# Patient Record
Sex: Male | Born: 1955 | Race: Black or African American | Hispanic: No | Marital: Married | Smoking: Never smoker
Health system: Southern US, Community
[De-identification: ages and names within clinical notes are randomized; demographics above are authoritative.]

## PROBLEM LIST (undated history)

## (undated) DIAGNOSIS — E11319 Type 2 diabetes mellitus with unspecified diabetic retinopathy without macular edema: Secondary | ICD-10-CM

## (undated) DIAGNOSIS — I10 Essential (primary) hypertension: Secondary | ICD-10-CM

## (undated) DIAGNOSIS — E119 Type 2 diabetes mellitus without complications: Secondary | ICD-10-CM

## (undated) DIAGNOSIS — G8929 Other chronic pain: Secondary | ICD-10-CM

## (undated) DIAGNOSIS — K219 Gastro-esophageal reflux disease without esophagitis: Secondary | ICD-10-CM

## (undated) HISTORY — PX: FRACTURE SURGERY: SHX138

---

## 2007-08-10 DIAGNOSIS — I639 Cerebral infarction, unspecified: Secondary | ICD-10-CM

## 2007-08-10 HISTORY — DX: Cerebral infarction, unspecified: I63.9

## 2011-02-13 DIAGNOSIS — I10 Essential (primary) hypertension: Secondary | ICD-10-CM | POA: Diagnosis present

## 2017-06-20 ENCOUNTER — Encounter: Payer: Self-pay | Admitting: *Deleted

## 2017-06-20 ENCOUNTER — Emergency Department
Admission: EM | Admit: 2017-06-20 | Discharge: 2017-06-20 | Disposition: A | Discharge status: Home / Self Care | Payer: Medicare Other | Attending: Emergency Medicine | Admitting: Emergency Medicine

## 2017-06-20 ENCOUNTER — Other Ambulatory Visit: Payer: Self-pay

## 2017-06-20 ENCOUNTER — Ambulatory Visit
Admission: EM | Admit: 2017-06-20 | Discharge: 2017-06-20 | Disposition: A | Discharge status: Hospice | Payer: Medicare Other | Source: Home / Self Care | Attending: Family Medicine | Admitting: Family Medicine

## 2017-06-20 ENCOUNTER — Emergency Department: Payer: Medicare Other

## 2017-06-20 ENCOUNTER — Encounter: Payer: Self-pay | Admitting: Emergency Medicine

## 2017-06-20 DIAGNOSIS — L03116 Cellulitis of left lower limb: Secondary | ICD-10-CM | POA: Diagnosis not present

## 2017-06-20 DIAGNOSIS — M25562 Pain in left knee: Secondary | ICD-10-CM | POA: Insufficient documentation

## 2017-06-20 DIAGNOSIS — Z794 Long term (current) use of insulin: Secondary | ICD-10-CM

## 2017-06-20 DIAGNOSIS — W19XXXA Unspecified fall, initial encounter: Secondary | ICD-10-CM | POA: Diagnosis not present

## 2017-06-20 DIAGNOSIS — Z5321 Procedure and treatment not carried out due to patient leaving prior to being seen by health care provider: Secondary | ICD-10-CM | POA: Diagnosis not present

## 2017-06-20 DIAGNOSIS — R6883 Chills (without fever): Secondary | ICD-10-CM | POA: Diagnosis not present

## 2017-06-20 DIAGNOSIS — E118 Type 2 diabetes mellitus with unspecified complications: Secondary | ICD-10-CM | POA: Diagnosis not present

## 2017-06-20 DIAGNOSIS — S8992XA Unspecified injury of left lower leg, initial encounter: Secondary | ICD-10-CM | POA: Diagnosis not present

## 2017-06-20 DIAGNOSIS — R509 Fever, unspecified: Secondary | ICD-10-CM | POA: Diagnosis not present

## 2017-06-20 HISTORY — DX: Essential (primary) hypertension: I10

## 2017-06-20 HISTORY — DX: Type 2 diabetes mellitus without complications: E11.9

## 2017-06-20 LAB — CBC WITH DIFFERENTIAL/PLATELET
BASOS ABS: 0 10*3/uL (ref 0–0.1)
BASOS PCT: 0 %
Eosinophils Absolute: 0.1 10*3/uL (ref 0–0.7)
Eosinophils Relative: 1 %
HEMATOCRIT: 45.8 % (ref 40.0–52.0)
HEMOGLOBIN: 14.7 g/dL (ref 13.0–18.0)
Lymphocytes Relative: 37 %
Lymphs Abs: 2.2 10*3/uL (ref 1.0–3.6)
MCH: 27.4 pg (ref 26.0–34.0)
MCHC: 32.1 g/dL (ref 32.0–36.0)
MCV: 85.3 fL (ref 80.0–100.0)
MONOS PCT: 9 %
Monocytes Absolute: 0.5 10*3/uL (ref 0.2–1.0)
NEUTROS ABS: 3.1 10*3/uL (ref 1.4–6.5)
NEUTROS PCT: 53 %
Platelets: 149 10*3/uL — ABNORMAL LOW (ref 150–440)
RBC: 5.37 MIL/uL (ref 4.40–5.90)
RDW: 14.5 % (ref 11.5–14.5)
WBC: 5.8 10*3/uL (ref 3.8–10.6)

## 2017-06-20 LAB — COMPREHENSIVE METABOLIC PANEL
ALK PHOS: 78 U/L (ref 38–126)
ALT: 17 U/L (ref 17–63)
ANION GAP: 6 (ref 5–15)
AST: 21 U/L (ref 15–41)
Albumin: 3.9 g/dL (ref 3.5–5.0)
BUN: 13 mg/dL (ref 6–20)
CALCIUM: 8.6 mg/dL — AB (ref 8.9–10.3)
CO2: 23 mmol/L (ref 22–32)
Chloride: 108 mmol/L (ref 101–111)
Creatinine, Ser: 1.06 mg/dL (ref 0.61–1.24)
GFR calc non Af Amer: >60 mL/min (ref >60)
Glucose, Bld: 172 mg/dL — ABNORMAL HIGH (ref 65–99)
Potassium: 4.2 mmol/L (ref 3.5–5.1)
SODIUM: 137 mmol/L (ref 135–145)
Total Bilirubin: 0.4 mg/dL (ref 0.3–1.2)
Total Protein: 7.5 g/dL (ref 6.5–8.1)

## 2017-06-20 LAB — SEDIMENTATION RATE: Sed Rate: 9 mm/hr (ref 0–20)

## 2017-06-20 LAB — LACTIC ACID, PLASMA: LACTIC ACID, VENOUS: 1.1 mmol/L (ref 0.5–1.9)

## 2017-06-20 LAB — C-REACTIVE PROTEIN: CRP: 1.2 mg/dL — AB (ref <1.0)

## 2017-06-20 MED ORDER — ACETAMINOPHEN 500 MG PO TABS
1000.0000 mg | ORAL_TABLET | Freq: Once | ORAL | Status: AC
  Administered 2017-06-20: 1000 mg via ORAL

## 2017-06-20 NOTE — ED Triage Notes (Signed)
Pt tripped on a rug at home and fell last night. Now c/o left knee pain and edema. Has hx of previous right hip injury that causes him to limp and has unsteady gait.

## 2017-06-20 NOTE — ED Notes (Signed)
Pt in car, eating, states that his is coming back inside quickly.

## 2017-06-20 NOTE — ED Notes (Signed)
No answer when called several times from lobby 

## 2017-06-20 NOTE — ED Notes (Signed)
Pt back in lobby after eating.

## 2017-06-20 NOTE — Discharge Instructions (Signed)
Recommend patient go to Emergency Department for further evaluation/management 

## 2017-06-20 NOTE — ED Triage Notes (Signed)
Pt fell Saturday, pain to left knee since. Swelling and tenderness to touch. No LOC in skin integrity. [facility] and sent here. Fever of 103F prior to tylenol adminstration.

## 2017-06-20 NOTE — ED Notes (Addendum)
Pt ambulates out of lobby door to parked car. Pt relations, Alfredia ClientMary Jo out to car to inquire what patient is doing. Pt ambulates at his baseline with a cane.

## 2017-06-20 NOTE — ED Provider Notes (Signed)
[facility]    CSN: [id number] Arrival date & time: 06/20/17  1127     History   Chief Complaint Chief Complaint  Patient presents with  . Fall  . Leg Pain    HPI Jose Richardson is a 61 y.o. male.   61 yo diabetic male with a c/o left knee pain and swelling since yesterday after falling 2 days ago (Saturday night) and landing on his knee and shin. Patient did scrape the skin on his shin. States last night he also started having chills.    The history is provided by the patient.  Fall   Leg Pain    Past Medical History:  Diagnosis Date  . Diabetes mellitus without complication (HCC)   . Hypertension     There are no active problems to display for this patient.   Past Surgical History:  Procedure Laterality Date  . FRACTURE SURGERY         Home Medications    Prior to Admission medications   Medication Sig Start Date End Date Taking? Authorizing Provider  insulin aspart (NOVOLOG) 100 UNIT/ML injection Inject 3 (three) times daily before meals into the skin.   Yes [provider]  insulin glargine (LANTUS) 100 UNIT/ML injection Inject at bedtime into the skin.   Yes [provider]  losartan (COZAAR) 25 MG tablet Take 25 mg daily by mouth.   Yes [provider]  lovastatin (MEVACOR) 20 MG tablet Take 20 mg at bedtime by mouth.   Yes [provider]  metFORMIN (GLUCOPHAGE) 500 MG tablet Take 500 mg 2 (two) times daily with a meal by mouth.   Yes [provider]  methadone (DOLOPHINE) 10 MG tablet Take 10 mg every 8 (eight) hours by mouth.   Yes [provider]  Pregabalin (LYRICA PO) Take 500 mg by mouth.   Yes [provider]    Family History Family History  Problem Relation Age of Onset  . Cancer Mother   . Stroke Father     Social History Social History   Tobacco Use  . Smoking status: Never Smoker  . Smokeless tobacco: Never Used  Substance Use Topics  . Alcohol  use: No    Frequency: Never  . Drug use: No     Allergies   Patient has no known allergies.   Review of Systems Review of Systems   Physical Exam Triage Vital Signs ED Triage Vitals  Enc Vitals Group     BP 06/20/17 1143 (!) 166/79     Pulse Rate 06/20/17 1143 77     Resp 06/20/17 1143 16     Temp 06/20/17 1143 (!) 103 F (39.4 C)     Temp Source 06/20/17 1143 Oral     SpO2 06/20/17 1143 98 %     Weight 06/20/17 1147 219 lb (99.3 kg)     Height 06/20/17 1147 5\' 6"  (1.676 m)     Head Circumference --      Peak Flow --      Pain Score 06/20/17 1151 8     Pain Loc --      Pain Edu? --      Excl. in GC? --    No data found.  Updated Vital Signs BP (!) 166/79 (BP Location: Left Arm)   Pulse 77   Temp (!) 103 F (39.4 C) (Oral)   Resp 16   Ht 5\' 6"  (1.676 m)   Wt 219 lb (99.3 kg)  SpO2 98%   BMI 35.35 kg/m   Visual Acuity Right Eye Distance:   Left Eye Distance:   Bilateral Distance:    Right Eye Near:   Left Eye Near:    Bilateral Near:     Physical Exam  Constitutional: He appears well-developed and well-nourished. No distress.  Musculoskeletal: He exhibits edema (left lower extremity).  Skin: He is not diaphoretic.  Left knee edema, blanchable erythema, warmth and tenderness to palpation;   Nursing note and vitals reviewed.    UC Treatments / Results  Labs (all labs ordered are listed, but only abnormal results are displayed) Labs Reviewed - No data to display  EKG  EKG Interpretation None       Radiology No results found.  Procedures Procedures (including critical care time)  Medications Ordered in UC Medications  acetaminophen (TYLENOL) tablet 1,000 mg (1,000 mg Oral Given 06/20/17 1149)     Initial Impression / Assessment and Plan / UC Course  I have reviewed the triage vital signs and the nursing notes.  Pertinent labs & imaging results that were available during my care of the patient were reviewed by me and considered in  my medical decision making (see chart for details).       Final Clinical Impressions(s) / UC Diagnoses   Final diagnoses:  Injury of left knee, initial encounter  Cellulitis of leg, left  Fever, unspecified fever cause  Chills  Controlled type 2 diabetes mellitus with complication, with long-term current use of insulin [facility](HCC)    ED Discharge Orders    None     1. diagnosis reviewed with patient; recommend patient go to ED for further evaluation and management. Patient verbalizes understanding and will proceed by private vehicle. Report called to [facility] triage RN.   Controlled Substance Prescriptions [place] Controlled Substance Registry consulted? Not Applicable   Payton Mccallumonty, Judy Pollman, MD 06/20/17 1227

## 2017-06-21 ENCOUNTER — Telehealth: Payer: Self-pay | Admitting: Emergency Medicine

## 2017-06-21 NOTE — Telephone Encounter (Signed)
Called patient due to lwot to inquire about condition and follow up plans. Left message.   

## 2018-02-02 ENCOUNTER — Other Ambulatory Visit: Payer: Self-pay

## 2018-02-02 ENCOUNTER — Emergency Department
Admission: EM | Admit: 2018-02-02 | Discharge: 2018-02-02 | Disposition: A | Discharge status: Home / Self Care | Payer: Medicare Other | Attending: Emergency Medicine | Admitting: Emergency Medicine

## 2018-02-02 ENCOUNTER — Emergency Department: Payer: Medicare Other

## 2018-02-02 DIAGNOSIS — R0789 Other chest pain: Secondary | ICD-10-CM | POA: Insufficient documentation

## 2018-02-02 DIAGNOSIS — Z79899 Other long term (current) drug therapy: Secondary | ICD-10-CM | POA: Insufficient documentation

## 2018-02-02 DIAGNOSIS — E119 Type 2 diabetes mellitus without complications: Secondary | ICD-10-CM | POA: Diagnosis not present

## 2018-02-02 DIAGNOSIS — R079 Chest pain, unspecified: Secondary | ICD-10-CM | POA: Diagnosis present

## 2018-02-02 DIAGNOSIS — I10 Essential (primary) hypertension: Secondary | ICD-10-CM | POA: Insufficient documentation

## 2018-02-02 DIAGNOSIS — Z794 Long term (current) use of insulin: Secondary | ICD-10-CM | POA: Insufficient documentation

## 2018-02-02 LAB — CBC
HCT: 38 % — ABNORMAL LOW (ref 40.0–52.0)
Hemoglobin: 12.7 g/dL — ABNORMAL LOW (ref 13.0–18.0)
MCH: 28.8 pg (ref 26.0–34.0)
MCHC: 33.4 g/dL (ref 32.0–36.0)
MCV: 86.2 fL (ref 80.0–100.0)
PLATELETS: 176 10*3/uL (ref 150–440)
RBC: 4.41 MIL/uL (ref 4.40–5.90)
RDW: 14.3 % (ref 11.5–14.5)
WBC: 7.6 10*3/uL (ref 3.8–10.6)

## 2018-02-02 LAB — COMPREHENSIVE METABOLIC PANEL
ALT: 20 U/L (ref 0–44)
ANION GAP: 7 (ref 5–15)
AST: 25 U/L (ref 15–41)
Albumin: 3.9 g/dL (ref 3.5–5.0)
Alkaline Phosphatase: 89 U/L (ref 38–126)
BUN: 16 mg/dL (ref 8–23)
CHLORIDE: 106 mmol/L (ref 98–111)
CO2: 25 mmol/L (ref 22–32)
CREATININE: 1.29 mg/dL — AB (ref 0.61–1.24)
Calcium: 9.1 mg/dL (ref 8.9–10.3)
GFR, EST NON AFRICAN AMERICAN: 58 mL/min — AB (ref >60)
Glucose, Bld: 120 mg/dL — ABNORMAL HIGH (ref 70–99)
POTASSIUM: 3.9 mmol/L (ref 3.5–5.1)
SODIUM: 138 mmol/L (ref 135–145)
Total Bilirubin: 0.5 mg/dL (ref 0.3–1.2)
Total Protein: 7.7 g/dL (ref 6.5–8.1)

## 2018-02-02 LAB — TROPONIN I: Troponin I: <0.03 ng/mL (ref <0.03)

## 2018-02-02 NOTE — ED Triage Notes (Signed)
Arrived via [facility] for CP that has been intermittent over last couple weeks. Pt report he was outside working in the yard today when the pain started. He has been getting this pain over last couple weeks and takes tums at home to relieve the pain. Denies SOB or N/V/D. 4/10 pain, substernal.

## 2018-02-02 NOTE — ED Provider Notes (Signed)
[facility] Emergency Department Provider Note   ____________________________________________    I have reviewed the triage vital signs and the nursing notes.   HISTORY  Chief Complaint Chest Pain     HPI Jose Richardson is a 62 y.o. male who presents with complaints of chest pain.  Patient reports he was working in the yard mowing his lawn when he developed discomfort substernally.  He describes it as a pressure-like sensation.  Now resolved.  He reports this is been occurring over the last 2 months without pattern.  Typically takes a Tums and that helps his discomfort.  However he has run out of Tums.  Denies diaphoresis.  No shortness of breath.  No pleurisy.  No leg swelling.  No history of heart disease.   Past Medical History:  Diagnosis Date  . Diabetes mellitus without complication (HCC)   . Hypertension     There are no active problems to display for this patient.   Past Surgical History:  Procedure Laterality Date  . FRACTURE SURGERY      Prior to Admission medications   Medication Sig Start Date End Date Taking? Authorizing Provider  insulin aspart (NOVOLOG) 100 UNIT/ML injection Inject 3 (three) times daily before meals into the skin.    [provider]  insulin glargine (LANTUS) 100 UNIT/ML injection Inject at bedtime into the skin.    [provider]  losartan (COZAAR) 25 MG tablet Take 25 mg daily by mouth.    [provider]  lovastatin (MEVACOR) 20 MG tablet Take 20 mg at bedtime by mouth.    [provider]  metFORMIN (GLUCOPHAGE) 500 MG tablet Take 500 mg 2 (two) times daily with a meal by mouth.    [provider]  methadone (DOLOPHINE) 10 MG tablet Take 10 mg every 8 (eight) hours by mouth.    [provider]  Pregabalin (LYRICA PO) Take 500 mg by mouth.    [provider]     Allergies Patient has no known allergies.  Family History  Problem Relation  Age of Onset  . Cancer Mother   . Stroke Father     Social History Social History   Tobacco Use  . Smoking status: Never Smoker  . Smokeless tobacco: Never Used  Substance Use Topics  . Alcohol use: No    Frequency: Never  . Drug use: No    Review of Systems  Constitutional: No fever/chills Eyes: No visual changes.  ENT: No sore throat. Cardiovascular: As above Respiratory: Denies shortness of breath. Gastrointestinal: No abdominal pain.  No nausea, no vomiting.   Genitourinary: Negative for dysuria. Musculoskeletal: Negative for back pain. Skin: Negative for rash. Neurological: Negative for headaches   ____________________________________________   PHYSICAL EXAM:  VITAL SIGNS: ED Triage Vitals  Enc Vitals Group     BP 02/02/18 1210 104/79     Pulse Rate 02/02/18 1153 74     Resp 02/02/18 1153 18     Temp 02/02/18 1153 98.1 F (36.7 C)     Temp Source 02/02/18 1153 Oral     SpO2 02/02/18 1153 97 %     Weight 02/02/18 1155 99.8 kg (220 lb)     Height 02/02/18 1155 1.727 m (5\' 8" )     Head Circumference --      Peak Flow --      Pain Score 02/02/18 1217 4     Pain Loc --      Pain Edu? --  Excl. in GC? --     Constitutional: Alert and oriented. . Pleasant and interactive  Nose: No congestion/rhinnorhea. Mouth/Throat: Mucous membranes are moist.   Neck:  Painless ROM Cardiovascular: Normal rate, regular rhythm. Grossly normal heart sounds.  Good peripheral circulation. Respiratory: Normal respiratory effort.  No retractions. Lungs CTAB. Gastrointestinal: Soft and nontender. No distention.  No CVA tenderness. Genitourinary: deferred Musculoskeletal: No lower extremity tenderness nor edema.  Warm and well perfused Neurologic:  Normal speech and language. No gross focal neurologic deficits are appreciated.  Skin:  Skin is warm, dry and intact. No rash noted. Psychiatric: Mood and affect are normal. Speech and behavior are  normal.  ____________________________________________   LABS (all labs ordered are listed, but only abnormal results are displayed)  Labs Reviewed  CBC - Abnormal; Notable for the following components:      Result Value   Hemoglobin 12.7 (*)    HCT 38.0 (*)    All other components within normal limits  COMPREHENSIVE METABOLIC PANEL - Abnormal; Notable for the following components:   Glucose, Bld 120 (*)    Creatinine, Ser 1.29 (*)    GFR calc non Af Amer 58 (*)    All other components within normal limits  TROPONIN I  TROPONIN I   ____________________________________________  EKG  ED ECG REPORT I, Jene Every, the attending physician, personally viewed and interpreted this ECG.  Date: 02/02/2018  Rhythm: normal sinus rhythm QRS Axis: normal Intervals: normal ST/T Wave abnormalities: normal Narrative Interpretation: no evidence of acute ischemia  ____________________________________________  RADIOLOGY  Chest x-ray unremarkable ____________________________________________   PROCEDURES  Procedure(s) performed: No  Procedures   Critical Care performed:No ____________________________________________   INITIAL IMPRESSION / ASSESSMENT AND PLAN / ED COURSE  Pertinent labs & imaging results that were available during my care of the patient were reviewed by me and considered in my medical decision making (see chart for details).  Patient overall well-appearing in no acute distress.  Symptoms have resolved in the emergency department.  EKG is reassuring.  Will check labs and place patient on cardiac monitor   Initial troponin is normal, lab work overall unremarkable.  Patient continues to feel well.  Informed him we will check a second troponin.   ----------------------------------------- 2:54 PM on 02/02/2018 -----------------------------------------  Second troponin is normal.  Patient continues to feel well this chest pain-free.  He is anxious to leave.   I feel this is reasonable he agrees to close outpatient follow-up with cardiology.  He will return if any chest pain    ____________________________________________   FINAL CLINICAL IMPRESSION(S) / ED DIAGNOSES  Final diagnoses:  Atypical chest pain        Note:  This document was prepared using Dragon voice recognition software and may include unintentional dictation errors.    Jene Every, MD 02/02/18 [phone]

## 2018-12-08 ENCOUNTER — Ambulatory Visit: Payer: Medicare Other

## 2018-12-08 ENCOUNTER — Ambulatory Visit
Admission: EM | Admit: 2018-12-08 | Discharge: 2018-12-08 | Disposition: A | Discharge status: Home / Self Care | Payer: Medicare Other | Attending: Family Medicine | Admitting: Family Medicine

## 2018-12-08 ENCOUNTER — Other Ambulatory Visit: Payer: Self-pay

## 2018-12-08 ENCOUNTER — Encounter: Payer: Self-pay | Admitting: Emergency Medicine

## 2018-12-08 DIAGNOSIS — Z794 Long term (current) use of insulin: Secondary | ICD-10-CM | POA: Diagnosis not present

## 2018-12-08 DIAGNOSIS — Z79899 Other long term (current) drug therapy: Secondary | ICD-10-CM | POA: Insufficient documentation

## 2018-12-08 DIAGNOSIS — E119 Type 2 diabetes mellitus without complications: Secondary | ICD-10-CM | POA: Diagnosis not present

## 2018-12-08 DIAGNOSIS — I10 Essential (primary) hypertension: Secondary | ICD-10-CM | POA: Insufficient documentation

## 2018-12-08 DIAGNOSIS — M79604 Pain in right leg: Secondary | ICD-10-CM | POA: Insufficient documentation

## 2018-12-08 DIAGNOSIS — M25551 Pain in right hip: Secondary | ICD-10-CM

## 2018-12-08 DIAGNOSIS — Z96641 Presence of right artificial hip joint: Secondary | ICD-10-CM | POA: Insufficient documentation

## 2018-12-08 NOTE — Discharge Instructions (Addendum)
Continue home medication as prescribed. Tylenol over the counter as needed. Ice/Heat. Rest. Drink plenty of fluids.   Follow up with your primary care physician this week as needed. Return to Urgent care for new or worsening concerns.
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
## 2018-12-24 ENCOUNTER — Ambulatory Visit
Admission: EM | Admit: 2018-12-24 | Discharge: 2018-12-24 | Discharge status: Against Medical Advice | Payer: Medicare Other | Attending: Family Medicine | Admitting: Family Medicine

## 2020-03-16 ENCOUNTER — Other Ambulatory Visit: Payer: Self-pay

## 2020-03-16 ENCOUNTER — Observation Stay: Payer: Medicare HMO

## 2020-03-16 ENCOUNTER — Ambulatory Visit
Admission: EM | Admit: 2020-03-16 | Discharge: 2020-03-16 | Disposition: A | Discharge status: Home / Self Care | Payer: Medicare HMO | Source: Home / Self Care | Attending: Family Medicine | Admitting: Family Medicine

## 2020-03-16 ENCOUNTER — Encounter: Payer: Self-pay | Admitting: Emergency Medicine

## 2020-03-16 ENCOUNTER — Observation Stay
Admission: EM | Admit: 2020-03-16 | Discharge: 2020-03-17 | Disposition: A | Discharge status: Home / Self Care | Payer: Medicare HMO | Attending: Internal Medicine | Admitting: Internal Medicine

## 2020-03-16 DIAGNOSIS — R9431 Abnormal electrocardiogram [ECG] [EKG]: Secondary | ICD-10-CM | POA: Diagnosis not present

## 2020-03-16 DIAGNOSIS — R072 Precordial pain: Secondary | ICD-10-CM | POA: Diagnosis not present

## 2020-03-16 DIAGNOSIS — I10 Essential (primary) hypertension: Secondary | ICD-10-CM | POA: Diagnosis not present

## 2020-03-16 DIAGNOSIS — Z79899 Other long term (current) drug therapy: Secondary | ICD-10-CM | POA: Diagnosis not present

## 2020-03-16 DIAGNOSIS — Z794 Long term (current) use of insulin: Secondary | ICD-10-CM | POA: Diagnosis not present

## 2020-03-16 DIAGNOSIS — Z8673 Personal history of transient ischemic attack (TIA), and cerebral infarction without residual deficits: Secondary | ICD-10-CM | POA: Diagnosis not present

## 2020-03-16 DIAGNOSIS — Z20822 Contact with and (suspected) exposure to covid-19: Secondary | ICD-10-CM | POA: Diagnosis not present

## 2020-03-16 DIAGNOSIS — R079 Chest pain, unspecified: Secondary | ICD-10-CM

## 2020-03-16 DIAGNOSIS — I440 Atrioventricular block, first degree: Secondary | ICD-10-CM

## 2020-03-16 DIAGNOSIS — E119 Type 2 diabetes mellitus without complications: Secondary | ICD-10-CM | POA: Insufficient documentation

## 2020-03-16 DIAGNOSIS — IMO0002 Reserved for concepts with insufficient information to code with codable children: Secondary | ICD-10-CM | POA: Diagnosis present

## 2020-03-16 HISTORY — DX: Gastro-esophageal reflux disease without esophagitis: K21.9

## 2020-03-16 HISTORY — DX: Type 2 diabetes mellitus with unspecified diabetic retinopathy without macular edema: E11.319

## 2020-03-16 HISTORY — DX: Other chronic pain: G89.29

## 2020-03-16 HISTORY — DX: Type 2 diabetes mellitus without complications: E11.9

## 2020-03-16 LAB — COMPREHENSIVE METABOLIC PANEL
ALT: 28 U/L (ref 0–44)
AST: 28 U/L (ref 15–41)
Albumin: 3.9 g/dL (ref 3.5–5.0)
Alkaline Phosphatase: 90 U/L (ref 38–126)
Anion gap: 5 (ref 5–15)
BUN: 17 mg/dL (ref 8–23)
CO2: 24 mmol/L (ref 22–32)
Calcium: 8.6 mg/dL — ABNORMAL LOW (ref 8.9–10.3)
Chloride: 107 mmol/L (ref 98–111)
Creatinine, Ser: 1.18 mg/dL (ref 0.61–1.24)
GFR calc Af Amer: >60 mL/min (ref >60)
GFR calc non Af Amer: >60 mL/min (ref >60)
Glucose, Bld: 186 mg/dL — ABNORMAL HIGH (ref 70–99)
Potassium: 4.4 mmol/L (ref 3.5–5.1)
Sodium: 136 mmol/L (ref 135–145)
Total Bilirubin: 0.5 mg/dL (ref 0.3–1.2)
Total Protein: 7.8 g/dL (ref 6.5–8.1)

## 2020-03-16 LAB — CBC WITH DIFFERENTIAL/PLATELET
Abs Immature Granulocytes: 0.03 10*3/uL (ref 0.00–0.07)
Basophils Absolute: 0 10*3/uL (ref 0.0–0.1)
Basophils Relative: 0 %
Eosinophils Absolute: 0.1 10*3/uL (ref 0.0–0.5)
Eosinophils Relative: 1 %
HCT: 39.6 % (ref 39.0–52.0)
Hemoglobin: 13.1 g/dL (ref 13.0–17.0)
Immature Granulocytes: 1 %
Lymphocytes Relative: 42 %
Lymphs Abs: 2.8 10*3/uL (ref 0.7–4.0)
MCH: 27.3 pg (ref 26.0–34.0)
MCHC: 33.1 g/dL (ref 30.0–36.0)
MCV: 82.5 fL (ref 80.0–100.0)
Monocytes Absolute: 0.5 10*3/uL (ref 0.1–1.0)
Monocytes Relative: 8 %
Neutro Abs: 3.2 10*3/uL (ref 1.7–7.7)
Neutrophils Relative %: 48 %
Platelets: 171 10*3/uL (ref 150–400)
RBC: 4.8 MIL/uL (ref 4.22–5.81)
RDW: 13.9 % (ref 11.5–15.5)
WBC: 6.6 10*3/uL (ref 4.0–10.5)
nRBC: 0 % (ref 0.0–0.2)

## 2020-03-16 LAB — LIPID PANEL
Cholesterol: 110 mg/dL (ref 0–200)
HDL: 49 mg/dL (ref >40)
LDL Cholesterol: 45 mg/dL (ref 0–99)
Total CHOL/HDL Ratio: 2.2 RATIO
Triglycerides: 80 mg/dL (ref <150)
VLDL: 16 mg/dL (ref 0–40)

## 2020-03-16 LAB — TROPONIN I (HIGH SENSITIVITY)
Troponin I (High Sensitivity): 8 ng/L (ref <18)
Troponin I (High Sensitivity): 8 ng/L (ref <18)

## 2020-03-16 LAB — GLUCOSE, CAPILLARY: Glucose-Capillary: 174 mg/dL — ABNORMAL HIGH (ref 70–99)

## 2020-03-16 MED ORDER — ENOXAPARIN SODIUM 40 MG/0.4ML ~~LOC~~ SOLN
40.0000 mg | SUBCUTANEOUS | Status: DC
  Administered 2020-03-16: 40 mg via SUBCUTANEOUS
  Filled 2020-03-16: qty 0.4

## 2020-03-16 MED ORDER — MORPHINE SULFATE (PF) 2 MG/ML IV SOLN: 2.0000 mg | INTRAVENOUS | Status: DC | PRN

## 2020-03-16 MED ORDER — ASPIRIN 81 MG PO CHEW
324.0000 mg | CHEWABLE_TABLET | Freq: Once | ORAL | Status: AC
  Administered 2020-03-16: 324 mg via ORAL

## 2020-03-16 MED ORDER — INSULIN ASPART 100 UNIT/ML ~~LOC~~ SOLN
0.0000 [IU] | SUBCUTANEOUS | Status: DC
  Administered 2020-03-16: 4 [IU] via SUBCUTANEOUS
  Administered 2020-03-17: 7 [IU] via SUBCUTANEOUS
  Administered 2020-03-17: 4 [IU] via SUBCUTANEOUS
  Administered 2020-03-17: 3 [IU] via SUBCUTANEOUS
  Filled 2020-03-16 (×4): qty 1

## 2020-03-16 MED ORDER — ONDANSETRON HCL 4 MG/2ML IJ SOLN: 4.0000 mg | Freq: Four times a day (QID) | INTRAMUSCULAR | Status: DC | PRN

## 2020-03-16 MED ORDER — NITROGLYCERIN 0.4 MG SL SUBL: 0.4000 mg | SUBLINGUAL_TABLET | SUBLINGUAL | Status: DC | PRN

## 2020-03-16 MED ORDER — ACETAMINOPHEN 325 MG PO TABS: 650.0000 mg | ORAL_TABLET | ORAL | Status: DC | PRN

## 2020-03-16 MED ORDER — ASPIRIN EC 81 MG PO TBEC
81.0000 mg | DELAYED_RELEASE_TABLET | Freq: Every day | ORAL | Status: DC
  Administered 2020-03-17: 81 mg via ORAL
  Filled 2020-03-16: qty 1

## 2020-03-16 MED ORDER — PRAVASTATIN SODIUM 40 MG PO TABS
40.0000 mg | ORAL_TABLET | Freq: Every day | ORAL | Status: DC
  Filled 2020-03-16 (×2): qty 1

## 2020-03-16 NOTE — ED Triage Notes (Signed)
Patient c/o chest pain that started yesterday.  Patient states that the pain in his chest radiates to his left shoulder and left shoulder blade.  Patient reports SOB when walking.

## 2020-03-16 NOTE — ED Notes (Signed)
Meal tray provided.

## 2020-03-16 NOTE — ED Provider Notes (Signed)
[facility]    CSN: [id number] Arrival date & time: 03/16/20  1252      History   Chief Complaint Chief Complaint  Patient presents with  . Chest Pain  . Shoulder Pain   HPI   64 year old male presents with chest pain.  Started yesterday.  He reports intermittent chest pain.  Left-sided.  Does not appear to be worse with exertion.  He reports that the pain radiates to his left shoulder blade.  Denies shortness of breath.  No history of MI or CAD.  Does have underlying diabetes and hypertension.  No relieving factors.  He states that his pain was worse at church today which prompted his visit here.  Past Medical History:  Diagnosis Date  . Diabetes mellitus without complication (HCC)   . Hypertension    Past Surgical History:  Procedure Laterality Date  . FRACTURE SURGERY     Home Medications    Prior to Admission medications   Medication Sig Start Date End Date Taking? Authorizing Provider  insulin aspart (NOVOLOG) 100 UNIT/ML injection Inject 3 (three) times daily before meals into the skin.   Yes [provider]  insulin glargine (LANTUS) 100 UNIT/ML injection Inject at bedtime into the skin.   Yes [provider]  losartan (COZAAR) 25 MG tablet Take 25 mg daily by mouth.   Yes [provider]  lovastatin (MEVACOR) 20 MG tablet Take 20 mg at bedtime by mouth.   Yes [provider]  metFORMIN (GLUCOPHAGE) 500 MG tablet Take 500 mg 2 (two) times daily with a meal by mouth.   Yes [provider]  methadone (DOLOPHINE) 10 MG tablet Take 10 mg every 8 (eight) hours by mouth.   Yes [provider]  Pregabalin (LYRICA PO) Take 500 mg by mouth.   Yes [provider]    Family History Family History  Problem Relation Age of Onset  . Cancer Mother   . Stroke Father     Social History Social History   Tobacco Use  . Smoking status: Never Smoker  . Smokeless tobacco: Never Used  Vaping Use  .  Vaping Use: Never used  Substance Use Topics  . Alcohol use: No  . Drug use: No     Allergies   Patient has no known allergies.   Review of Systems Review of Systems  Constitutional: Negative.   Cardiovascular: Positive for chest pain.   Physical Exam Triage Vital Signs ED Triage Vitals  Enc Vitals Group     BP 03/16/20 1308 124/70     Pulse Rate 03/16/20 1308 63     Resp 03/16/20 1308 16     Temp 03/16/20 1308 98.1 F (36.7 C)     Temp Source 03/16/20 1308 Oral     SpO2 03/16/20 1308 98 %     Weight 03/16/20 1305 232 lb (105.2 kg)     Height 03/16/20 1305 5\' 6"  (1.676 m)     Head Circumference --      Peak Flow --      Pain Score 03/16/20 1305 5     Pain Loc --      Pain Edu? --      Excl. in GC? --    Updated Vital Signs BP 124/70 (BP Location: Right Arm)   Pulse 63   Temp 98.1 F (36.7 C) (Oral)   Resp 16   Ht 5\' 6"  (1.676 m)   Wt 105.2 kg   SpO2 98%  BMI 37.45 kg/m   Visual Acuity Right Eye Distance:   Left Eye Distance:   Bilateral Distance:    Right Eye Near:   Left Eye Near:    Bilateral Near:     Physical Exam Vitals and nursing note reviewed.  Constitutional:      General: He is not in acute distress.    Appearance: He is well-developed. He is not ill-appearing.  HENT:     Head: Normocephalic and atraumatic.  Eyes:     General:        Right eye: No discharge.        Left eye: No discharge.     Conjunctiva/sclera: Conjunctivae normal.  Cardiovascular:     Rate and Rhythm: Normal rate and regular rhythm.     Heart sounds: No murmur heard.   Pulmonary:     Effort: Pulmonary effort is normal.     Breath sounds: Wheezing present.  Neurological:     Mental Status: He is alert.  Psychiatric:        Mood and Affect: Mood normal.        Behavior: Behavior normal.    UC Treatments / Results  Labs (all labs ordered are listed, but only abnormal results are displayed) Labs Reviewed  COMPREHENSIVE METABOLIC PANEL - Abnormal; Notable  for the following components:      Result Value   Glucose, Bld 186 (*)    Calcium 8.6 (*)    All other components within normal limits  CBC WITH DIFFERENTIAL/PLATELET  TROPONIN I (HIGH SENSITIVITY)    EKG Interpretation: Sinus rhythm with prolonged PR interval at the rate of 66.  No ST or T wave changes.  This EKG was discussed with cardiology Dr. Duke Salvia who agrees.  Radiology No results found.  Procedures Procedures (including critical care time)  Medications Ordered in UC Medications  aspirin chewable tablet 324 mg (324 mg Oral Given 03/16/20 1347)    Initial Impression / Assessment and Plan / UC Course  I have reviewed the triage vital signs and the nursing notes.  Pertinent labs & imaging results that were available during my care of the patient were reviewed by me and considered in my medical decision making (see chart for details).    64 year old male presents with left-sided chest pain.  Given patient's risk factors and abnormal EKG, labs were drawn and patient was sent to the ER via EMS for cardiac work-up.  Final Clinical Impressions(s) / UC Diagnoses   Final diagnoses:  Chest pain, unspecified type  Abnormal EKG   Discharge Instructions   None    ED Prescriptions    None     PDMP not reviewed this encounter.   Tommie Sams, [place] 03/16/20 1556

## 2020-03-16 NOTE — ED Provider Notes (Signed)
[facility] Emergency Department Provider Note   ____________________________________________   First MD Initiated Contact with Patient 03/16/20 1953     (approximate)  I have reviewed the triage vital signs and the nursing notes.   HISTORY  Chief Complaint Chest Pain    HPI Jose Richardson is a 64 y.o. male history of hypertension diabetes  Patient went to urgent care today for chest pain.  Reports he started having pretty severe chest pain at church today.  This prompted him to go to urgent care for further evaluation.  Pain is located over the left chest radiates toward his left shoulder and feels like a heaviness.  A 64 year old patient with a history of treated diabetes, hypertension and obesity presents for evaluation of chest pain. Initial onset of pain was more than 6 hours ago. The patient's chest pain is described as heaviness/pressure/tightness, is not worse with exertion and is relieved by nitroglycerin. The patient's chest pain is middle- or left-sided, is not well-localized, is not sharp and does radiate to the arms/jaw/neck. The patient does not complain of nausea and denies diaphoresis. The patient has no history of stroke, has no history of peripheral artery disease, has not smoked in the past 90 days, has no relevant family history of coronary artery disease (first degree relative at less than age 47) and has no history of hypercholesterolemia.   Received aspirin at urgent care and nitroglycerin with almost complete relief of discomfort.  Reports right now is just a very very mild discomfort over his chest    Past Medical History:  Diagnosis Date  . Diabetes mellitus without complication (HCC)   . Hypertension     Patient Active Problem List   Diagnosis Date Noted  . Chest pain 03/16/2020  . History of CVA with residual deficit 03/16/2020  . First degree AV block 03/16/2020  . Hypertension, essential 02/13/2011  . Diabetes  mellitus type II, uncontrolled (HCC) 02/13/2011    Past Surgical History:  Procedure Laterality Date  . FRACTURE SURGERY      Prior to Admission medications   Medication Sig Start Date End Date Taking? Authorizing Provider  insulin aspart (NOVOLOG) 100 UNIT/ML injection Inject 3 (three) times daily before meals into the skin.    [provider]  insulin glargine (LANTUS) 100 UNIT/ML injection Inject at bedtime into the skin.    [provider]  losartan (COZAAR) 25 MG tablet Take 25 mg daily by mouth.    [provider]  lovastatin (MEVACOR) 20 MG tablet Take 20 mg at bedtime by mouth.    [provider]  metFORMIN (GLUCOPHAGE) 500 MG tablet Take 500 mg 2 (two) times daily with a meal by mouth.    [provider]  methadone (DOLOPHINE) 10 MG tablet Take 10 mg every 8 (eight) hours by mouth.    [provider]  Pregabalin (LYRICA PO) Take 500 mg by mouth.    [provider]    Allergies Patient has no known allergies.  Family History  Problem Relation Age of Onset  . Cancer Mother   . Stroke Father     Social History Social History   Tobacco Use  . Smoking status: Never Smoker  . Smokeless tobacco: Never Used  Vaping Use  . Vaping Use: Never used  Substance Use Topics  . Alcohol use: No  . Drug use: No    Review of Systems Constitutional: No fever/chills Eyes: No visual changes. ENT: No sore throat. Cardiovascular:  See HPI .  Heavy pressure radiating to his left chest, rated slight toward his left shoulder.  No sharp pains.  No pain with deep inspiration.  No leg swelling Respiratory: Denies shortness of breath. Gastrointestinal: No abdominal pain.   Genitourinary: Negative for dysuria. Musculoskeletal: Negative for back pain. Skin: Negative for rash. Neurological: Negative for headaches, areas of focal weakness or numbness.    ____________________________________________   PHYSICAL EXAM:  VITAL  SIGNS: ED Triage Vitals  Enc Vitals Group     BP 03/16/20 1519 105/64     Pulse Rate 03/16/20 1519 64     Resp 03/16/20 1519 16     Temp 03/16/20 1519 98.2 F (36.8 C)     Temp Source 03/16/20 1519 Oral     SpO2 03/16/20 1519 95 %     Weight --      Height --      Head Circumference --      Peak Flow --      Pain Score 03/16/20 1517 4     Pain Loc --      Pain Edu? --      Excl. in GC? --     Constitutional: Alert and oriented. Well appearing and in no acute distress. Eyes: Conjunctivae are normal. Head: Atraumatic. Nose: No congestion/rhinnorhea. Mouth/Throat: Mucous membranes are moist. Neck: No stridor.  Cardiovascular: Normal rate, regular rhythm. Grossly normal heart sounds.  Good peripheral circulation. Respiratory: Normal respiratory effort.  No retractions. Lungs CTAB. Gastrointestinal: Soft and nontender. No distention. Musculoskeletal: No lower extremity tenderness nor edema. Neurologic:  Normal speech and language. No gross focal neurologic deficits are appreciated.  Skin:  Skin is warm, dry and intact. No rash noted. Psychiatric: Mood and affect are normal. Speech and behavior are normal.  ____________________________________________   LABS (all labs ordered are listed, but only abnormal results are displayed)  Labs Reviewed  SARS CORONAVIRUS 2 BY RT PCR (HOSPITAL ORDER, PERFORMED IN [facility])  LIPID PANEL  HEMOGLOBIN A1C  HIV ANTIBODY (ROUTINE TESTING W REFLEX)  TROPONIN I (HIGH SENSITIVITY)   ____________________________________________  EKG  Reviewed and interpreted by me at 1500 Heart rate 65 QRS 80 QTc 400 First-degree AV block, no evidence of acute ischemia ____________________________________________  RADIOLOGY  No results found.   ____________________________________________   PROCEDURES  Procedure(s) performed: None  Procedures  Critical Care performed:  No  ____________________________________________   INITIAL IMPRESSION / ASSESSMENT AND PLAN / ED COURSE  Pertinent labs & imaging results that were available during my care of the patient were reviewed by me and considered in my medical decision making (see chart for details).   Differential diagnosis includes, but is not limited to, ACS, aortic dissection, pulmonary embolism, cardiac tamponade, pneumothorax, pneumonia, pericarditis, myocarditis, GI-related causes including esophagitis/gastritis, and musculoskeletal chest wall pain.    Chest pain is largely improved. HEAR Score: 6     No hypoxia, no tachycardia.  Denies any shortness of breath.  No noted significant risk factors for pulmonary embolism, and his symptoms seem inconsistent with that or dissection.  Patient resting comfortably relief of symptoms with aspirin and nitro down to about a 1 out of 10 are almost no pain at all as he describes it.  Discussed with cardiology, they request he be admitted for further work-up given multiple risk factors and presentation.  Patient understanding and agreeable  Clinical Course as of Mar 16 2129  Wynelle Link Mar 16, 2020  2036 [facility] and clinical history discussed with Dr.  Duke Salvia. Recommends admit for observation and cardiology to see in AM   [MQ]    Clinical Course User Index [MQ] Sharyn Creamer, MD    Jose Richardson was evaluated in Emergency Department on 03/16/2020 for the symptoms described in the history of present illness. He was evaluated in the context of the global COVID-19 pandemic, which necessitated consideration that the patient might be at risk for infection with the SARS-CoV-2 virus that causes COVID-19. Institutional protocols and algorithms that pertain to the evaluation of patients at risk for COVID-19 are in a state of rapid change based on information released by regulatory bodies including the CDC and federal and state organizations. These policies and algorithms were followed  during the patient's care in the ED.  ____________________________________________   FINAL CLINICAL IMPRESSION(S) / ED DIAGNOSES  Final diagnoses:  Chest pain, moderate coronary artery risk        Note:  This document was prepared using Dragon voice recognition software and may include unintentional dictation errors       Sharyn Creamer, MD 03/16/20 2130

## 2020-03-16 NOTE — H&P (Signed)
History and Physical    Syed Zukas ION:[id number] DOB: [birth year] DOA: 03/16/2020  PCP: Fannie Knee, MD   Patient coming from: Home  I have personally briefly reviewed patient's old medical records in [facility]  Chief Complaint: Left-sided chest pain  HPI: Jose Richardson is a 64 y.o. male with medical history significant for diabetes and hypertension who presents to the emergency room with left-sided chest pain, radiating to the left shoulder and between shoulder blades.  Pain is pressure-like, non exertional and started the day prior while sitting down relaxing.  On the morning of arrival, while at church the pain got acutely worse prompting patient to go to the urgent care who sent him over to the emergency room for evaluation.Patient received aspirin and nitroglycerin at urgent care with near complete relief of his pain  He denies cough, fever or chills, denies associated nausea, vomiting diaphoresis or associated palpitations or lightheadedness.   ED Course: On arrival in the emergency room his pain was 2 out of 10, BP borderline low of 105/64 with otherwise normal vitals.  EKG showed no acute ST-T wave changes but showed first-degree AV block that is old.  Troponin was 8x2.  Other labs unremarkable.  The emergency room provider spoke with cardiologist, Dr. Brynda Greathouse who recommended in admission for chest pain rule out.  Hospitalist consulted for admission.  Review of Systems: As per HPI otherwise all other systems on review of systems negative.    Past Medical History:  Diagnosis Date  . Diabetes mellitus without complication (HCC)   . Hypertension     Past Surgical History:  Procedure Laterality Date  . FRACTURE SURGERY       reports that he has never smoked. He has never used smokeless tobacco. He reports that he does not drink alcohol and does not use drugs.  No Known Allergies  Family History  Problem Relation Age of Onset  . Cancer Mother   .  Stroke Father       Prior to Admission medications   Medication Sig Start Date End Date Taking? Authorizing Provider  insulin aspart (NOVOLOG) 100 UNIT/ML injection Inject 3 (three) times daily before meals into the skin.    [provider]  insulin glargine (LANTUS) 100 UNIT/ML injection Inject at bedtime into the skin.    [provider]  losartan (COZAAR) 25 MG tablet Take 25 mg daily by mouth.    [provider]  lovastatin (MEVACOR) 20 MG tablet Take 20 mg at bedtime by mouth.    [provider]  metFORMIN (GLUCOPHAGE) 500 MG tablet Take 500 mg 2 (two) times daily with a meal by mouth.    [provider]  methadone (DOLOPHINE) 10 MG tablet Take 10 mg every 8 (eight) hours by mouth.    [provider]  Pregabalin (LYRICA PO) Take 500 mg by mouth.    [provider]    Physical Exam: Vitals:   03/16/20 1519 03/16/20 1841 03/16/20 2022 03/16/20 2022  BP: 105/64 132/75  (!) 147/80  Pulse: 64 (!) 58  69  Resp: 16 17  18   Temp: 98.2 F (36.8 C)     TempSrc: Oral     SpO2: 95% 99% 97% 97%     Vitals:   03/16/20 1519 03/16/20 1841 03/16/20 2022 03/16/20 2022  BP: 105/64 132/75  (!) 147/80  Pulse: 64 (!) 58  69  Resp: 16 17  18   Temp: 98.2 F (36.8 C)  TempSrc: Oral     SpO2: 95% 99% 97% 97%      Constitutional: Alert and oriented x 3 . Not in any apparent distress HEENT:      Head: Normocephalic and atraumatic.         Eyes: PERLA, EOMI, Conjunctivae are normal. Sclera is non-icteric.       Mouth/Throat: Mucous membranes are moist.       Neck: Supple with no signs of meningismus. Cardiovascular: Regular rate and rhythm. No murmurs, gallops, or rubs. 2+ symmetrical distal pulses are present . No JVD. No LE edema Respiratory: Respiratory effort normal .Lungs sounds clear bilaterally. N wheezes, crackles, or rhonchi.  Gastrointestinal: Soft, non tender, and non distended with positive bowel sounds. No rebound  or guarding. Genitourinary: No CVA tenderness. Musculoskeletal: Nontender with normal range of motion in all extremities. No cyanosis, or erythema of extremities. Neurologic: Normal speech and language. Face is symmetric. Moving all extremities. No gross focal neurologic deficits . Skin: Skin is warm, dry.  No rash or ulcers Psychiatric: Mood and affect are normal Speech and behavior are norma   Labs on Admission: I have personally reviewed following labs and imaging studies  CBC: Recent Labs  Lab 03/16/20 1406  WBC 6.6  NEUTROABS 3.2  HGB 13.1  HCT 39.6  MCV 82.5  PLT 171   Basic Metabolic Panel: Recent Labs  Lab 03/16/20 1406  NA 136  K 4.4  CL 107  CO2 24  GLUCOSE 186*  BUN 17  CREATININE 1.18  CALCIUM 8.6*   GFR: Estimated Creatinine Clearance: 71 mL/min (by C-G formula based on SCr of 1.18 mg/dL). Liver Function Tests: Recent Labs  Lab 03/16/20 1406  AST 28  ALT 28  ALKPHOS 90  BILITOT 0.5  PROT 7.8  ALBUMIN 3.9   No results for input(s): LIPASE, AMYLASE in the last 168 hours. No results for input(s): AMMONIA in the last 168 hours. Coagulation Profile: No results for input(s): INR, PROTIME in the last 168 hours. Cardiac Enzymes: No results for input(s): CKTOTAL, CKMB, CKMBINDEX, TROPONINI in the last 168 hours. BNP (last 3 results) No results for input(s): PROBNP in the last 8760 hours. HbA1C: No results for input(s): HGBA1C in the last 72 hours. CBG: No results for input(s): GLUCAP in the last 168 hours. Lipid Profile: No results for input(s): CHOL, HDL, LDLCALC, TRIG, CHOLHDL, LDLDIRECT in the last 72 hours. Thyroid Function Tests: No results for input(s): TSH, T4TOTAL, FREET4, T3FREE, THYROIDAB in the last 72 hours. Anemia Panel: No results for input(s): VITAMINB12, FOLATE, FERRITIN, TIBC, IRON, RETICCTPCT in the last 72 hours. Urine analysis: No results found for: COLORURINE, APPEARANCEUR, LABSPEC, PHURINE, GLUCOSEU, HGBUR, BILIRUBINUR,  KETONESUR, PROTEINUR, UROBILINOGEN, NITRITE, LEUKOCYTESUR  Radiological Exams on Admission: No results found.  EKG: Independently reviewed. Interpretation : Normal sinus rhythm with no acute ST-T wave changes, physical AV block  Assessment/Plan 64 year old male with a history of diabetes and hypertension and history of stroke who presents with 1 day history of typical chest pain, with a nonacute EKG and negative troponins    Chest pain -Patient describes typical chest pain, though non exertional and has CAD risk factors.  Pain was relieved with aspirin and nitroglycerin given at emergent care center prior to arrival in the ER -Daily aspirin, statin, nitroglycerin sublingual as needed chest pain with morphine for breakthrough -We will hold beta-blocker because of first-degree AV block and borderline low blood pressure on arrival, though low BP could be due to NTG -Keep n.p.o. for  possible stress test in the a.m. -Cardiology consult.  ER provider previously spoke with Dr. Chilton Si.    Hypertension, essential -Hold antihypertensives due to soft blood pressures in the ER    Diabetes mellitus type II, uncontrolled (HCC) -Sliding scale insulin coverage pending med rec    History of CVA with residual deficit -On aspirin and statin    First degree AV block -Appears old on review of past EKGs.  Holding beta-blocker for now.    DVT prophylaxis: Lovenox  Code Status: full code  Family Communication:  none  Disposition Plan: Back to previous home environment Consults called: Cardiology Status: Observation    Andris Baumann MD [facility]     03/16/2020, 9:14 PM

## 2020-03-16 NOTE — ED Notes (Signed)
Pt to ED via [facility] from [facility] urgent care for chest pain. Pt is in NAD.  Pt had trop at [facility]

## 2020-03-16 NOTE — ED Triage Notes (Signed)
Pt here from urgent care after having chest pain that radiates to the left shoulder- pt states it has been happening since yesterday- pt denies [facility]

## 2020-03-16 NOTE — ED Notes (Signed)
Pt states he is having left-sided chest pain which has decreased now to a 2/10 which he describes as pressure.  Pt is in no acute distress at this times.  Pt is on cardiac monitor; no needs expressed at this time.

## 2020-03-17 ENCOUNTER — Encounter: Payer: Self-pay | Admitting: Internal Medicine

## 2020-03-17 ENCOUNTER — Encounter (HOSPITAL_BASED_OUTPATIENT_CLINIC_OR_DEPARTMENT_OTHER): Payer: Medicare HMO

## 2020-03-17 DIAGNOSIS — Z8673 Personal history of transient ischemic attack (TIA), and cerebral infarction without residual deficits: Secondary | ICD-10-CM

## 2020-03-17 DIAGNOSIS — R079 Chest pain, unspecified: Secondary | ICD-10-CM

## 2020-03-17 DIAGNOSIS — I10 Essential (primary) hypertension: Secondary | ICD-10-CM

## 2020-03-17 DIAGNOSIS — E1140 Type 2 diabetes mellitus with diabetic neuropathy, unspecified: Secondary | ICD-10-CM | POA: Insufficient documentation

## 2020-03-17 DIAGNOSIS — E785 Hyperlipidemia, unspecified: Secondary | ICD-10-CM | POA: Diagnosis not present

## 2020-03-17 DIAGNOSIS — Z794 Long term (current) use of insulin: Secondary | ICD-10-CM

## 2020-03-17 LAB — NM MYOCAR MULTI W/SPECT W/WALL MOTION / EF
LV dias vol: 133 mL (ref 62–150)
LV sys vol: 48 mL
Peak HR: 81 {beats}/min
Percent HR: 52 %
Rest HR: 78 {beats}/min
SDS: 1
SRS: 0
SSS: 1
TID: 1.01

## 2020-03-17 LAB — GLUCOSE, CAPILLARY
Glucose-Capillary: 188 mg/dL — ABNORMAL HIGH (ref 70–99)
Glucose-Capillary: 90 mg/dL (ref 70–99)
Glucose-Capillary: 124 mg/dL — ABNORMAL HIGH (ref 70–99)
Glucose-Capillary: 204 mg/dL — ABNORMAL HIGH (ref 70–99)

## 2020-03-17 LAB — SARS CORONAVIRUS 2 BY RT PCR (HOSPITAL ORDER, PERFORMED IN ~~LOC~~ HOSPITAL LAB): SARS Coronavirus 2: NEGATIVE

## 2020-03-17 MED ORDER — CLOPIDOGREL BISULFATE 75 MG PO TABS
75.0000 mg | ORAL_TABLET | Freq: Every day | ORAL | Status: DC
  Administered 2020-03-17: 75 mg via ORAL
  Filled 2020-03-17: qty 1

## 2020-03-17 MED ORDER — TECHNETIUM TC 99M TETROFOSMIN IV KIT
9.6810 | PACK | Freq: Once | INTRAVENOUS | Status: AC | PRN
  Administered 2020-03-17: 9.681 via INTRAVENOUS
  Filled 2020-03-17: qty 10

## 2020-03-17 MED ORDER — ADULT MULTIVITAMIN W/MINERALS CH
1.0000 | ORAL_TABLET | Freq: Every day | ORAL | Status: DC
  Administered 2020-03-17: 1 via ORAL
  Filled 2020-03-17: qty 1

## 2020-03-17 MED ORDER — AMLODIPINE BESYLATE 5 MG PO TABS
10.0000 mg | ORAL_TABLET | Freq: Every day | ORAL | Status: DC
  Administered 2020-03-17: 10 mg via ORAL
  Filled 2020-03-17: qty 2

## 2020-03-17 MED ORDER — PANTOPRAZOLE SODIUM 40 MG IV SOLR
40.0000 mg | INTRAVENOUS | Status: DC
  Administered 2020-03-17: 40 mg via INTRAVENOUS
  Filled 2020-03-17: qty 40

## 2020-03-17 MED ORDER — INSULIN GLARGINE 100 UNIT/ML ~~LOC~~ SOLN: 30.0000 [IU] | Freq: Every day | SUBCUTANEOUS | Status: AC

## 2020-03-17 MED ORDER — METHADONE HCL 10 MG PO TABS
10.0000 mg | ORAL_TABLET | Freq: Three times a day (TID) | ORAL | Status: DC
  Administered 2020-03-17 (×2): 10 mg via ORAL
  Filled 2020-03-17 (×2): qty 1

## 2020-03-17 MED ORDER — TECHNETIUM TC 99M TETROFOSMIN IV KIT
30.0000 | PACK | Freq: Once | INTRAVENOUS | Status: AC | PRN
  Administered 2020-03-17: 31.634 via INTRAVENOUS
  Filled 2020-03-17: qty 30

## 2020-03-17 MED ORDER — PANTOPRAZOLE SODIUM 40 MG PO TBEC
40.0000 mg | DELAYED_RELEASE_TABLET | Freq: Every day | ORAL | 0 refills | Status: AC
Start: 2020-03-17 — End: 2020-04-16

## 2020-03-17 MED ORDER — LOSARTAN POTASSIUM 50 MG PO TABS
25.0000 mg | ORAL_TABLET | Freq: Every day | ORAL | Status: DC
  Administered 2020-03-17: 25 mg via ORAL
  Filled 2020-03-17: qty 1

## 2020-03-17 MED ORDER — PREGABALIN 50 MG PO CAPS
50.0000 mg | ORAL_CAPSULE | Freq: Two times a day (BID) | ORAL | Status: DC
  Administered 2020-03-17: 50 mg via ORAL
  Filled 2020-03-17: qty 1

## 2020-03-17 MED ORDER — REGADENOSON 0.4 MG/5ML IV SOLN
0.4000 mg | Freq: Once | INTRAVENOUS | Status: AC
  Administered 2020-03-17: 0.4 mg via INTRAVENOUS

## 2020-03-17 MED ORDER — INSULIN GLARGINE 100 UNIT/ML ~~LOC~~ SOLN: 50.0000 [IU] | Freq: Every day | SUBCUTANEOUS | Status: DC

## 2020-03-17 NOTE — ED Notes (Signed)
Patient back from test at this time 

## 2020-03-17 NOTE — ED Notes (Signed)
Patient off the floor at this time.

## 2020-03-17 NOTE — Consult Note (Signed)
Cardiology Consultation:   Patient ID: Jose Richardson; [id number]; 1956-06-08   Admit date: 03/16/2020 Date of Consult: 03/17/2020  Primary Care Provider: Fannie Knee, MD Primary Cardiologist: New to [facility] - consult by Kirke Corin Primary Electrophysiologist:  None   Patient Profile:   Jose Richardson is a 64 y.o. 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 Para March.  History of Present Illness:   [name] previous underwent cardiac monitoring through [facility] cardiology in 2015 with results being unavailable for review. He denies any prior ischemic evaluations. He was in his usual state of health until 03/15/2020, when, while sitting down he developed intermittent left-sided chest pain that radiated to the left shoulder. Pain would come and go without association to activity and would last for several minutes in duration with spontaneous resolution. There were no associated symptoms. He was sitting in church on 03/16/2020, when the pain returned, though was a little more severe. Pain was pressure-like in quality. Again, there were no associated symptoms. Due to an increase in the pain, he was seen at a local urgent care on 8/8 with intermittent and worsening left-sided chest pain that radiated to his left shoulder blade and was without associated symptoms.  EKG showed sinus rhythm with significant first-degree AV block with no significant ST-T changes and was reviewed by our on-call cardiologist.  He was given SL NTG which resolved his pain. He was sent to the ED for further evaluation.  Upon the patient's arrival to [facility] they were found to have stable vitals. EKG showed NSR, 66 bpm, first-degree AV block, no acute ST-T changes, CXR showed no cardiopulmonary disease. Labs were  significant for high-sensitivity troponin of 8 with a delta unchanged, Covid negative. He has been chest pain free since. He is scheduled for Lexiscan MPI today. Currently, he is without complaints.   Past Medical History:  Diagnosis Date  . Chronic pain   . CVA (cerebral vascular accident) (HCC) 2009  . Diabetes mellitus type 2, insulin dependent (HCC)   . Diabetic retinopathy (HCC)   . GERD (gastroesophageal reflux disease)   . Hypertension     Past Surgical History:  Procedure Laterality Date  . FRACTURE SURGERY       Home Meds: Prior to Admission medications   Medication Sig Start Date End Date Taking? Authorizing Provider  amLODipine (NORVASC) 10 MG tablet Take 10 mg by mouth daily. 02/16/20  Yes [provider]  clopidogrel (PLAVIX) 75 MG tablet Take 75 mg by mouth daily. 03/07/20  Yes [provider]  glucagon 1 MG injection Inject 1 mg into the muscle as needed. For hypoglycemia. 06/13/18  Yes [provider]  insulin aspart (NOVOLOG) 100 UNIT/ML injection Inject 10 Units into the skin 3 (three) times daily with meals.    Yes [provider]  insulin glargine (LANTUS) 100 UNIT/ML injection Inject 50 Units into the skin at bedtime.    Yes [provider]  losartan (COZAAR) 25 MG tablet Take 25 mg daily by mouth.   Yes [provider]  lovastatin (MEVACOR) 20 MG tablet Take 20 mg at bedtime by mouth.   Yes [provider]  metFORMIN (GLUCOPHAGE) 500 MG tablet Take 500 mg 2 (two) times daily with a meal by mouth.   Yes  [provider]  methadone (DOLOPHINE) 10 MG tablet Take 10 mg every 8 (eight) hours by mouth.   Yes [provider]  Multiple Vitamin (MULTIVITAMIN WITH MINERALS) TABS tablet Take 1 tablet by mouth daily.   Yes [provider]  OZEMPIC, 0.25 OR 0.5 MG/DOSE, 2 MG/1.5ML SOPN Inject 0.25 mg into the skin once a week. 03/06/20  Yes [provider]  Pregabalin (LYRICA PO) Take  500 mg by mouth 2 (two) times daily.    Yes [provider]    Inpatient Medications: Scheduled Meds: . amLODipine  10 mg Oral Daily  . aspirin EC  81 mg Oral Daily  . clopidogrel  75 mg Oral Daily  . enoxaparin (LOVENOX) injection  40 mg Subcutaneous Q24H  . insulin aspart  0-20 Units Subcutaneous Q4H  . insulin glargine  50 Units Subcutaneous QHS  . losartan  25 mg Oral Daily  . methadone  10 mg Oral Q8H  . multivitamin with minerals  1 tablet Oral Daily  . pantoprazole (PROTONIX) IV  40 mg Intravenous Q24H  . pravastatin  40 mg Oral q1800  . pregabalin  50 mg Oral BID   Continuous Infusions:  PRN Meds: acetaminophen, morphine injection, nitroGLYCERIN, ondansetron (ZOFRAN) IV  Allergies:  No Known Allergies  Social History:   Social History   Socioeconomic History  . Marital status: Married    Spouse name: Not on file  . Number of children: Not on file  . Years of education: Not on file  . Highest education level: Not on file  Occupational History  . Not on file  Tobacco Use  . Smoking status: Never Smoker  . Smokeless tobacco: Never Used  Vaping Use  . Vaping Use: Never used  Substance and Sexual Activity  . Alcohol use: No  . Drug use: No  . Sexual activity: Not on file  Other Topics Concern  . Not on file  Social History Narrative  . Not on file   Social Determinants of Health   Financial Resource Strain:   . Difficulty of Paying Living Expenses:   Food Insecurity:   . Worried About Programme researcher, broadcasting/film/video in the Last Year:   . Barista in the Last Year:   Transportation Needs:   . Freight forwarder (Medical):   Marland Kitchen Lack of Transportation (Non-Medical):   Physical Activity:   . Days of Exercise per Week:   . Minutes of Exercise per Session:   Stress:   . Feeling of Stress :   Social Connections:   . Frequency of Communication with Friends and Family:   . Frequency of Social Gatherings with Friends and Family:   . Attends  Religious Services:   . Active Member of Clubs or Organizations:   . Attends Banker Meetings:   Marland Kitchen Marital Status:   Intimate Partner Violence:   . Fear of Current or Ex-Partner:   . Emotionally Abused:   Marland Kitchen Physically Abused:   . Sexually Abused:      Family History:   Family History  Problem Relation Age of Onset  . Cancer Mother   . Stroke Father     ROS:  Review of Systems  Constitutional: Negative for chills, diaphoresis, fever, malaise/fatigue and weight loss.  HENT: Negative for congestion.   Eyes: Negative for discharge and redness.  Respiratory: Negative for cough, sputum production, shortness of breath and wheezing.   Cardiovascular: Positive for chest pain. Negative for palpitations, orthopnea, claudication,  leg swelling and PND.  Gastrointestinal: Positive for heartburn. Negative for abdominal pain, nausea and vomiting.  Musculoskeletal: Positive for joint pain. Negative for falls and myalgias.       Left shoulder pain  Skin: Negative for rash.  Neurological: Negative for dizziness, tingling, tremors, sensory change, speech change, focal weakness, loss of consciousness and weakness.  Endo/Heme/Allergies: Does not bruise/bleed easily.  Psychiatric/Behavioral: Negative for substance abuse. The patient is not nervous/anxious.   All other systems reviewed and are negative.     Physical Exam/Data:   Vitals:   03/17/20 0200 03/17/20 0330 03/17/20 0609 03/17/20 0802  BP: 133/76 124/76 134/73 134/76  Pulse: 68 (!) 56 68 66  Resp: 16 13 12 11   Temp:      TempSrc:      SpO2: 97% 97% 98% 98%   No intake or output data in the 24 hours ending 03/17/20 1143 There were no vitals filed for this visit. There is no height or weight on file to calculate BMI.   Physical Exam: General: Well developed, well nourished, in no acute distress. Head: Normocephalic, atraumatic, sclera non-icteric, no xanthomas, nares without discharge.  Neck: Negative for carotid  bruits. JVD not elevated. Lungs: Clear bilaterally to auscultation without wheezes, rales, or rhonchi. Breathing is unlabored. Heart: RRR with S1 S2. No murmurs, rubs, or gallops appreciated. Abdomen: Soft, non-tender, non-distended with normoactive bowel sounds. No hepatomegaly. No rebound/guarding. No obvious abdominal masses. Msk:  Strength and tone appear normal for age. Extremities: No clubbing or cyanosis. No edema. Distal pedal pulses are 2+ and equal bilaterally. Neuro: Alert and oriented X 3. No facial asymmetry. No focal deficit. Moves all extremities spontaneously. Psych:  Responds to questions appropriately with a normal affect.   EKG:  The EKG was personally reviewed and demonstrates: NSR, 66 bpm, first-degree AV block, no acute ST-T changes.  When compared to prior studies first-degree AV block was not new Telemetry:  Telemetry was personally reviewed and demonstrates: SR  Weights: There were no vitals filed for this visit.  Relevant CV Studies:  None available for review  Laboratory Data:  Chemistry Recent Labs  Lab 03/16/20 1406  NA 136  K 4.4  CL 107  CO2 24  GLUCOSE 186*  BUN 17  CREATININE 1.18  CALCIUM 8.6*  GFRNONAA >60  GFRAA >60  ANIONGAP 5    Recent Labs  Lab 03/16/20 1406  PROT 7.8  ALBUMIN 3.9  AST 28  ALT 28  ALKPHOS 90  BILITOT 0.5   Hematology Recent Labs  Lab 03/16/20 1406  WBC 6.6  RBC 4.80  HGB 13.1  HCT 39.6  MCV 82.5  MCH 27.3  MCHC 33.1  RDW 13.9  PLT 171   Cardiac EnzymesNo results for input(s): TROPONINI in the last 168 hours. No results for input(s): TROPIPOC in the last 168 hours.  BNPNo results for input(s): BNP, PROBNP in the last 168 hours.  DDimer No results for input(s): DDIMER in the last 168 hours.  Radiology/Studies:  DG Chest 2 View  Result Date: 03/16/2020 IMPRESSION: No active cardiopulmonary disease. Electronically Signed   By: 05/16/2020 M.D.   On: 03/16/2020 21:34    Assessment and Plan:    1.  Chest pain with moderate risk for cardiac etiology: -Currently, chest pain free -High-sensitivity troponin negative x2 -EKG nonacute -ASA -SL NTG as needed -No beta-blocker in the setting of first-degree AV block and relative hypotension -PTA statin -Lexiscan MPI as ordered by IM -Further recommendations pending these  results  2.  History of CVA: -He remains on PTA aspirin/Plavix -PTA statin  3.  HTN: -Blood pressure well controlled currently -PTA amlodipine and losartan  4.  HLD: -LDL 45 this admission -PTA statin   For questions or updates, please contact [facility] Please consult [email or web address] for contact info under Cardiology/STEMI.   Signed, Eula Listenyan Rayvin Abid, PA-C [facility] Pager: [phone] 03/17/2020, 11:43 AM

## 2020-03-17 NOTE — ED Notes (Signed)
Pt resting quietly, room dark, respirations equal and unlabored. Will continue to monitor.

## 2020-03-17 NOTE — Discharge Summary (Signed)
[facility] [place] at [facility]   PATIENT NAME: Jose Richardson    MR#:  [id number]  DATE OF BIRTH:  04/16/1956  DATE OF ADMISSION:  03/16/2020 ADMITTING PHYSICIAN: Andris Baumann, MD  DATE OF DISCHARGE: 03/17/2020  PRIMARY CARE PHYSICIAN: Fannie Knee, MD    ADMISSION DIAGNOSIS:  Chest pain [R07.9]  DISCHARGE DIAGNOSIS:  Active Problems:   Chest pain   Hypertension, essential   Diabetes mellitus type II, uncontrolled (HCC)   History of CVA with residual deficit   First degree AV block   SECONDARY DIAGNOSIS:   Past Medical History:  Diagnosis Date  . Chronic pain   . CVA (cerebral vascular accident) (HCC) 2009  . Diabetes mellitus type 2, insulin dependent (HCC)   . Diabetic retinopathy (HCC)   . GERD (gastroesophageal reflux disease)   . Hypertension     HOSPITAL COURSE:   1.  Chest pain.  Patient had a negative stress test and cardiac enzymes were negative.  Patient feeling better.  Likely GERD.  I did prescribe Protonix. 2.  Essential hypertension.  Patient on Norvasc and Cozaar 3.  Type 2 diabetes mellitus with neuropathy.  Did not get Lantus last night and sugars are okay today.  Cut back the Lantus to 30 units daily.  On Lyrica. 4.  History of completed ischemic CVA with right-sided weakness on aspirin and statin 5.  First-degree AV block 6.  GERD prescribed Protonix 7.  Chronic pain on methadone  DISCHARGE CONDITIONS:   Satisfactory  CONSULTS OBTAINED:  Treatment Team:  Antonieta Iba, MD  DRUG ALLERGIES:  No Known Allergies  DISCHARGE MEDICATIONS:   Allergies as of 03/17/2020   No Known Allergies     Medication List    TAKE these medications   amLODipine 10 MG tablet Commonly known as: NORVASC Take 10 mg by mouth daily.   clopidogrel 75 MG tablet Commonly known as: PLAVIX Take 75 mg by mouth daily.   glucagon 1 MG injection Inject 1 mg into the muscle as needed. For hypoglycemia.   insulin aspart 100 UNIT/ML  injection Commonly known as: novoLOG Inject 10 Units into the skin 3 (three) times daily with meals.   insulin glargine 100 UNIT/ML injection Commonly known as: LANTUS Inject 0.3 mLs (30 Units total) into the skin at bedtime. What changed: how much to take   losartan 25 MG tablet Commonly known as: COZAAR Take 25 mg daily by mouth.   lovastatin 20 MG tablet Commonly known as: MEVACOR Take 20 mg at bedtime by mouth.   LYRICA PO Take 500 mg by mouth 2 (two) times daily.   metFORMIN 500 MG tablet Commonly known as: GLUCOPHAGE Take 500 mg 2 (two) times daily with a meal by mouth.   methadone 10 MG tablet Commonly known as: DOLOPHINE Take 10 mg every 8 (eight) hours by mouth.   multivitamin with minerals Tabs tablet Take 1 tablet by mouth daily.   Ozempic (0.25 or 0.5 MG/DOSE) 2 MG/1.5ML Sopn Generic drug: Semaglutide(0.25 or 0.5MG /DOS) Inject 0.25 mg into the skin once a week.   pantoprazole 40 MG tablet Commonly known as: Protonix Take 1 tablet (40 mg total) by mouth daily.        DISCHARGE INSTRUCTIONS:   Follow-up with PMD 5 days  If you experience worsening of your admission symptoms, develop shortness of breath, life threatening emergency, suicidal or homicidal thoughts you must seek medical attention immediately by calling 911 or calling your MD immediately  if  symptoms less severe.  You Must read complete instructions/literature along with all the possible adverse reactions/side effects for all the Medicines you take and that have been prescribed to you. Take any new Medicines after you have completely understood and accept all the possible adverse reactions/side effects.   Please note  You were cared for by a hospitalist during your hospital stay. If you have any questions about your discharge medications or the care you received while you were in the hospital after you are discharged, you can call the unit and asked to speak with the hospitalist on call if  the hospitalist that took care of you is not available. Once you are discharged, your primary care physician will handle any further medical issues. Please note that NO REFILLS for any discharge medications will be authorized once you are discharged, as it is imperative that you return to your primary care physician (or establish a relationship with a primary care physician if you do not have one) for your aftercare needs so that they can reassess your need for medications and monitor your lab values.    Today   CHIEF COMPLAINT:   Chief Complaint  Patient presents with  . Chest Pain    HISTORY OF PRESENT ILLNESS:  Jose Richardson  is a 64 y.o. male came in with chest pain   VITAL SIGNS:  Blood pressure 134/65, pulse 68, temperature 98.3 F (36.8 C), temperature source Oral, resp. rate 17, SpO2 98 %.  PHYSICAL EXAMINATION:  GENERAL:  64 y.o.-year-old patient lying in the bed with no acute distress.  EYES: Pupils equal, round, reactive to light and accommodation. No scleral icterus. Extraocular muscles intact.  HEENT: Head atraumatic, normocephalic. Oropharynx and nasopharynx clear.   LUNGS: Normal breath sounds bilaterally, no wheezing, rales,rhonchi or crepitation. No use of accessory muscles of respiration.  CARDIOVASCULAR: S1, S2 normal. No murmurs, rubs, or gallops.  ABDOMEN: Soft, non-tender, non-distended.  EXTREMITIES: No pedal edema.  NEUROLOGIC: Cranial nerves II through XII are intact. Muscle strength 5/5 in all extremities. Sensation intact. Gait not checked.  PSYCHIATRIC: The patient is alert and oriented x 3.  SKIN: No obvious rash, lesion, or ulcer.   DATA REVIEW:   CBC Recent Labs  Lab 03/16/20 1406  WBC 6.6  HGB 13.1  HCT 39.6  PLT 171    Chemistries  Recent Labs  Lab 03/16/20 1406  NA 136  K 4.4  CL 107  CO2 24  GLUCOSE 186*  BUN 17  CREATININE 1.18  CALCIUM 8.6*  AST 28  ALT 28  ALKPHOS 90  BILITOT 0.5    Microbiology Results  Results for  orders placed or performed during the hospital encounter of 03/16/20  SARS Coronavirus 2 by RT PCR (hospital order, performed in [facility] hospital lab) Nasopharyngeal Nasopharyngeal Swab     Status: None   Collection Time: 03/16/20 11:34 PM   Specimen: Nasopharyngeal Swab  Result Value Ref Range Status   SARS Coronavirus 2 NEGATIVE NEGATIVE Final    Comment: (NOTE) SARS-CoV-2 target nucleic acids are NOT DETECTED.  The SARS-CoV-2 RNA is generally detectable in upper and lower respiratory specimens during the acute phase of infection. The lowest concentration of SARS-CoV-2 viral copies this assay can detect is 250 copies / mL. A negative result does not preclude SARS-CoV-2 infection and should not be used as the sole basis for treatment or other patient management decisions.  A negative result may occur with improper specimen collection / handling, submission of specimen other  than nasopharyngeal swab, presence of viral mutation(s) within the areas targeted by this assay, and inadequate number of viral copies (<250 copies / mL). A negative result must be combined with clinical observations, patient history, and epidemiological information.  Fact Sheet for Patients:   BoilerBrush.com.cy  Fact Sheet for Healthcare Providers: https://pope.com/  This test is not yet approved or  cleared by the [place] FDA and has been authorized for detection and/or diagnosis of SARS-CoV-2 by FDA under an Emergency Use Authorization (EUA).  This EUA will remain in effect (meaning this test can be used) for the duration of the COVID-19 declaration under Section 564(b)(1) of the Act, 21 U.S.C. section 360bbb-3(b)(1), unless the authorization is terminated or revoked sooner.  Performed at [facility], [address] [place] [place], [place] [address]     RADIOLOGY:  DG Chest 2 View  Result Date: 03/16/2020 CLINICAL DATA:  Chest pain EXAM:  CHEST - 2 VIEW COMPARISON:  February 02, 2018 FINDINGS: The heart size and mediastinal contours are unchanged with mild cardiomegaly. Both lungs are clear. The visualized skeletal structures are unremarkable. IMPRESSION: No active cardiopulmonary disease. Electronically Signed   By: Jonna Clark M.D.   On: 03/16/2020 21:34   NM Myocar Multi W/Spect W/Wall Motion / EF  Result Date: 03/17/2020  There was no ST segment deviation noted during stress.  No T wave inversion was noted during stress.  The study is normal.  This is a low risk study.  The left ventricular ejection fraction is normal (55-65%).      Management plans discussed with the patient, and he is in agreement.  CODE STATUS:     Code Status Orders  (From admission, onward)         Start     Ordered   03/16/20 2105  Full code  Continuous        03/16/20 2107        Code Status History    This patient has a current code status but no historical code status.   Advance Care Planning Activity      TOTAL TIME TAKING CARE OF THIS PATIENT: 35 minutes.    [name] M.D on 03/17/2020 at 5:15 PM  Between 7am to 6pm - Pager - [phone]  After 6pm go to [email or web address] - password [email or web address]  [facility]  CC: Primary care physician; Fannie Knee, MD

## 2020-03-17 NOTE — ED Notes (Signed)
Resumed care from [place], [place]. Pt resting comfortably. No needs expressed at this time; will continue to monitor.

## 2020-11-03 ENCOUNTER — Other Ambulatory Visit: Payer: Self-pay

## 2020-11-03 ENCOUNTER — Encounter: Payer: Self-pay | Admitting: Emergency Medicine

## 2020-11-03 ENCOUNTER — Ambulatory Visit
Admission: EM | Admit: 2020-11-03 | Discharge: 2020-11-03 | Disposition: A | Discharge status: Home / Self Care | Payer: Medicare HMO | Attending: Physician Assistant | Admitting: Physician Assistant

## 2020-11-03 ENCOUNTER — Ambulatory Visit (INDEPENDENT_AMBULATORY_CARE_PROVIDER_SITE_OTHER): Payer: Medicare HMO

## 2020-11-03 DIAGNOSIS — M25512 Pain in left shoulder: Secondary | ICD-10-CM

## 2020-11-03 DIAGNOSIS — M778 Other enthesopathies, not elsewhere classified: Secondary | ICD-10-CM

## 2020-11-03 DIAGNOSIS — K0889 Other specified disorders of teeth and supporting structures: Secondary | ICD-10-CM | POA: Diagnosis not present

## 2020-11-03 MED ORDER — AMOXICILLIN-POT CLAVULANATE 875-125 MG PO TABS: 1.0000 | ORAL_TABLET | Freq: Two times a day (BID) | ORAL | 0 refills | Status: AC

## 2020-11-03 MED ORDER — MELOXICAM 7.5 MG PO TABS: 7.5000 mg | ORAL_TABLET | Freq: Every day | ORAL | 1 refills | Status: AC

## 2020-11-03 NOTE — Discharge Instructions (Addendum)
SHOULDER PAIN: X-ray shows mild-moderate arthritis of shoulder, but pain consistent with tendinitis. Stressed avoiding painful activities . Reviewed RICE guidelines. Use medications as directed, including NSAIDs. If no NSAIDs have been prescribed for you today, you may take Aleve or Motrin over the counter. May use Tylenol in between doses of NSAIDs.  If no improvement in the next 1-2 weeks, f/u with PCP or return to our office for reexamination, and please feel free to call or return at any time for any questions or concerns you may have and we will be happy to help you!      DENTAL PAIN: Follow up with dentist at the next available appointment.  In the meantime, we will cover for dental infection with Augmentin.  Take NSAIDs/Tylenol for pain relief. Consider Orajel also. May ice the area. Follow up with dentist in the next few days. In some cases, the tooth may needed to be extracted. Follow up with [place] or ER sooner if the condition worsens before the dental appointment. If they develop a fever, significant soft tissue swelling, or worse pain, go to ER   [facility]  Dentist [address]  [phone]  [facility] [address]  In [place]  [phone]  [facility] [address]  [phone]  [facility] [address] [place]  [phone]

## 2020-11-03 NOTE — ED Triage Notes (Signed)
Pt c/o dental pain. Started about a week ago after he chipped a tooth. He states it is on the left, bottom. Denies fever. Pt c/o left shoulder pain. He states the pain radiates down his entire arm. Started about a month ago.

## 2020-11-03 NOTE — ED Provider Notes (Signed)
[facility]    CSN: [id number] Arrival date & time: 11/03/20  1124      History   Chief Complaint Chief Complaint  Patient presents with  . Dental Pain  . Shoulder Pain    left    HPI Jose Richardson is a 65 y.o. male presenting for 2 separate complaints.  First, patient states that he has had dental pain for the past 1 to 2 weeks.  He says that he broke a tooth when he is eating and started to have pain a couple of days after.  He denies any associated fever, facial swelling, sore throat or painful swallowing.  Has not seen a dentist.  Next, patient complains of left-sided shoulder pain for about 2 weeks. Started after a slip and fall on ice. Did not land on shoulder or left side--says he landed on his bottom.  He says that the pain radiates all the way down his arm.  Pain is worse when he is driving and using his left arm.  Pain improves when he rests the arm.  Pain also worse at night.  He says that he sleeps with his elbow bent and is arm raised and that causes more pain.  He says the pain is intermittent.  Denies any pain currently.  No numbness, weakness or tingling.  Past medical history significant for type 2 diabetes on insulin, history of stroke with right-sided weakness, hypertension, GERD and chronic pain. Patient on long term methadone and Lyrica.  He has no other complaints or concerns today.  HPI  Past Medical History:  Diagnosis Date  . Chronic pain   . CVA (cerebral vascular accident) (HCC) 2009  . Diabetes mellitus type 2, insulin dependent (HCC)   . Diabetic retinopathy (HCC)   . GERD (gastroesophageal reflux disease)   . Hypertension     Patient Active Problem List   Diagnosis Date Noted  . Type 2 diabetes mellitus with diabetic neuropathy, with long-term current use of insulin (HCC)   . Chest pain 03/16/2020  . Hx of completed stroke 03/16/2020  . First degree AV block 03/16/2020  . Hypertension, essential 02/13/2011  . Diabetes  mellitus type II, uncontrolled (HCC) 02/13/2011    Past Surgical History:  Procedure Laterality Date  . FRACTURE SURGERY         Home Medications    Prior to Admission medications   Medication Sig Start Date End Date Taking? Authorizing Provider  amLODipine (NORVASC) 10 MG tablet Take 10 mg by mouth daily. 02/16/20  Yes [provider]  amoxicillin-clavulanate (AUGMENTIN) 875-125 MG tablet Take 1 tablet by mouth every 12 (twelve) hours for 10 days. 11/03/20 11/13/20 Yes Shirlee Latch, PA-C  clopidogrel (PLAVIX) 75 MG tablet Take 75 mg by mouth daily. 03/07/20  Yes [provider]  insulin aspart (NOVOLOG) 100 UNIT/ML injection Inject 10 Units into the skin 3 (three) times daily with meals.    Yes [provider]  insulin glargine (LANTUS) 100 UNIT/ML injection Inject 0.3 mLs (30 Units total) into the skin at bedtime. 03/17/20  Yes Wieting, Richard, MD  losartan (COZAAR) 25 MG tablet Take 25 mg daily by mouth.   Yes [provider]  lovastatin (MEVACOR) 20 MG tablet Take 20 mg at bedtime by mouth.   Yes [provider]  meloxicam (MOBIC) 7.5 MG tablet Take 1 tablet (7.5 mg total) by mouth daily for 15 days. 11/03/20 11/18/20 Yes Eusebio Friendly B, PA-C  metFORMIN (GLUCOPHAGE) 500 MG tablet Take  500 mg 2 (two) times daily with a meal by mouth.   Yes [provider]  methadone (DOLOPHINE) 10 MG tablet Take 10 mg every 8 (eight) hours by mouth.   Yes [provider]  Multiple Vitamin (MULTIVITAMIN WITH MINERALS) TABS tablet Take 1 tablet by mouth daily.   Yes [provider]  OZEMPIC, 0.25 OR 0.5 MG/DOSE, 2 MG/1.5ML SOPN Inject 0.25 mg into the skin once a week. 03/06/20  Yes [provider]  Pregabalin (LYRICA PO) Take 500 mg by mouth 2 (two) times daily.    Yes [provider]  glucagon 1 MG injection Inject 1 mg into the muscle as needed. For hypoglycemia. 06/13/18   [provider]  pantoprazole  (PROTONIX) 40 MG tablet Take 1 tablet (40 mg total) by mouth daily. 03/17/20 04/16/20  [name], MD    Family History Family History  Problem Relation Age of Onset  . Cancer Mother   . Stroke Father     Social History Social History   Tobacco Use  . Smoking status: Never Smoker  . Smokeless tobacco: Never Used  Vaping Use  . Vaping Use: Never used  Substance Use Topics  . Alcohol use: No  . Drug use: No     Allergies   Patient has no known allergies.   Review of Systems Review of Systems  Constitutional: Negative for fatigue and fever.  HENT: Positive for dental problem.   Musculoskeletal: Positive for arthralgias. Negative for back pain and neck pain.  Skin: Negative for color change and wound.  Neurological: Negative for numbness and headaches.  Hematological: Negative for adenopathy.     Physical Exam Triage Vital Signs ED Triage Vitals  Enc Vitals Group     BP 11/03/20 1153 (!) 154/79     Pulse Rate 11/03/20 1153 71     Resp 11/03/20 1153 18     Temp 11/03/20 1153 98.2 F (36.8 C)     Temp Source 11/03/20 1153 Oral     SpO2 11/03/20 1153 100 %     Weight 11/03/20 1149 231 lb 14.8 oz (105.2 kg)     Height 11/03/20 1149 5\' 6"  (1.676 m)     Head Circumference --      Peak Flow --      Pain Score 11/03/20 1149 5     Pain Loc --      Pain Edu? --      Excl. in [place]? --    No data found.  Updated Vital Signs BP (!) 154/79 (BP Location: Right Arm)   Pulse 71   Temp 98.2 F (36.8 C) (Oral)   Resp 18   Ht 5\' 6"  (1.676 m)   Wt 231 lb 14.8 oz (105.2 kg)   SpO2 100%   BMI 37.43 kg/m       Physical Exam Vitals and nursing note reviewed.  Constitutional:      General: He is not in acute distress.    Appearance: Normal appearance. He is well-developed. He is not ill-appearing.  HENT:     Head: Normocephalic and atraumatic.     Nose: Nose normal.     Mouth/Throat:     Mouth: Mucous membranes are moist.     Dentition: Abnormal dentition.  Dental tenderness and dental caries present.     Pharynx: Oropharynx is clear.      Comments: Broken tooth bottom right. Tooth is tender to palpation. Eyes:     General: No scleral icterus.  Conjunctiva/sclera: Conjunctivae normal.  Cardiovascular:     Rate and Rhythm: Normal rate and regular rhythm.     Heart sounds: Normal heart sounds.  Pulmonary:     Effort: Pulmonary effort is normal. No respiratory distress.     Breath sounds: Normal breath sounds.  Musculoskeletal:     Left shoulder: Tenderness (diffuse TTP anterior shoulder and biceps groove. ) present. No swelling or bony tenderness. Normal range of motion. Normal strength.     Cervical back: Normal range of motion and neck supple. No tenderness.  Skin:    General: Skin is warm and dry.  Neurological:     General: No focal deficit present.     Mental Status: He is alert. Mental status is at baseline.     Motor: No weakness.     Gait: Gait normal.  Psychiatric:        Mood and Affect: Mood normal.        Behavior: Behavior normal.        Thought Content: Thought content normal.      UC Treatments / Results  Labs (all labs ordered are listed, but only abnormal results are displayed) Labs Reviewed - No data to display  EKG   Radiology DG Shoulder Left  Result Date: 11/03/2020 CLINICAL DATA:  Pain for 1 month.  No known injury. EXAM: LEFT SHOULDER - 2+ VIEW COMPARISON:  None. FINDINGS: There is no evidence of fracture or dislocation. Moderate glenohumeral and acromioclavicular degenerative change. Soft tissues are unremarkable. IMPRESSION: No evidence of acute fracture or dislocation. Moderate glenohumeral and acromioclavicular degenerative change Electronically Signed   By: Feliberto Harts MD   On: 11/03/2020 12:53    Procedures Procedures (including critical care time)  Medications Ordered in UC Medications - No data to display  Initial Impression / Assessment and Plan / UC Course  I have reviewed the  triage vital signs and the nursing notes.  Pertinent labs & imaging results that were available during my care of the patient were reviewed by me and considered in my medical decision making (see chart for details).   65 year old male presenting for dental pain and left shoulder pain  1.  Dental pain: Exam reveals broken tooth.  Treating to cover infection at this time with Augmentin.  Advised him to continue his home pain medication and can take Tylenol if needs to.  I gave him a few different contact numbers for dentist and advised him to follow-up with a dentist as soon as possible as he will likely need to have the tooth pulled or root canal.  ED precautions reviewed.  2.  Left shoulder pain: Imaging of left shoulder obtained today.  Imaging independently reviewed by me and negative for any acute abnormality.  There is underlying arthritis.  I reviewed this with patient.  Advised him that his exam is more consistent with a tendinitis.  Treating with meloxicam at this time and supportive care.  Advised him to follow-up with orthopedics or PCP if not getting better over the next couple weeks or symptoms worsen.  Patient agreeable.   Final Clinical Impressions(s) / UC Diagnoses   Final diagnoses:  Pain, dental  Tendinitis of left shoulder  Acute pain of left shoulder     Discharge Instructions     SHOULDER PAIN: X-ray shows mild-moderate arthritis of shoulder, but pain consistent with tendinitis. Stressed avoiding painful activities . Reviewed RICE guidelines. Use medications as directed, including NSAIDs. If no NSAIDs have been prescribed for you today,  you may take Aleve or Motrin over the counter. May use Tylenol in between doses of NSAIDs.  If no improvement in the next 1-2 weeks, f/u with PCP or return to our office for reexamination, and please feel free to call or return at any time for any questions or concerns you may have and we will be happy to help you!      DENTAL PAIN: Follow  up with dentist at the next available appointment.  In the meantime, we will cover for dental infection with Augmentin.  Take NSAIDs/Tylenol for pain relief. Consider Orajel also. May ice the area. Follow up with dentist in the next few days. In some cases, the tooth may needed to be extracted. Follow up with us or ER sooner if the condition worsens before the dental appointment. If they develop a fever, significant soft tissue swelling, or worse pain, go to ER   [facility] [address]  [phone]  [facility] [address]  [place]  [phone]  [facility] [address]  [phone]  [facility] [address]  [phone]      ED Prescriptions    Medication Sig Dispense Auth. Provider   amoxicillin-clavulanate (AUGMENTIN) 875-125 MG tablet Take 1 tablet by mouth every 12 (twelve) hours for 10 days. 20 tablet Eusebio FriendlyEaves, Eryc Bodey B, PA-C   meloxicam (MOBIC) 7.5 MG tablet Take 1 tablet (7.5 mg total) by mouth daily for 15 days. 15 tablet Shirlee LatchEaves, Iysha Mishkin B, PA-C     I have reviewed the PDMP during this encounter.   Shirlee Latchaves, Vernesha Talbot B, PA-C 11/03/20 1346

## 2021-03-18 ENCOUNTER — Other Ambulatory Visit: Payer: Self-pay

## 2022-02-01 ENCOUNTER — Other Ambulatory Visit (HOSPITAL_COMMUNITY): Payer: Self-pay

## 2022-08-13 IMAGING — CR DG SHOULDER 2+V*L*
3 series · 3 of 3 positions shown · non-contrast
Comparison: None.

CLINICAL DATA: Pain for 1 month.  No known injury.

EXAM:
LEFT SHOULDER - 2+ VIEW

[shoulder grashey]
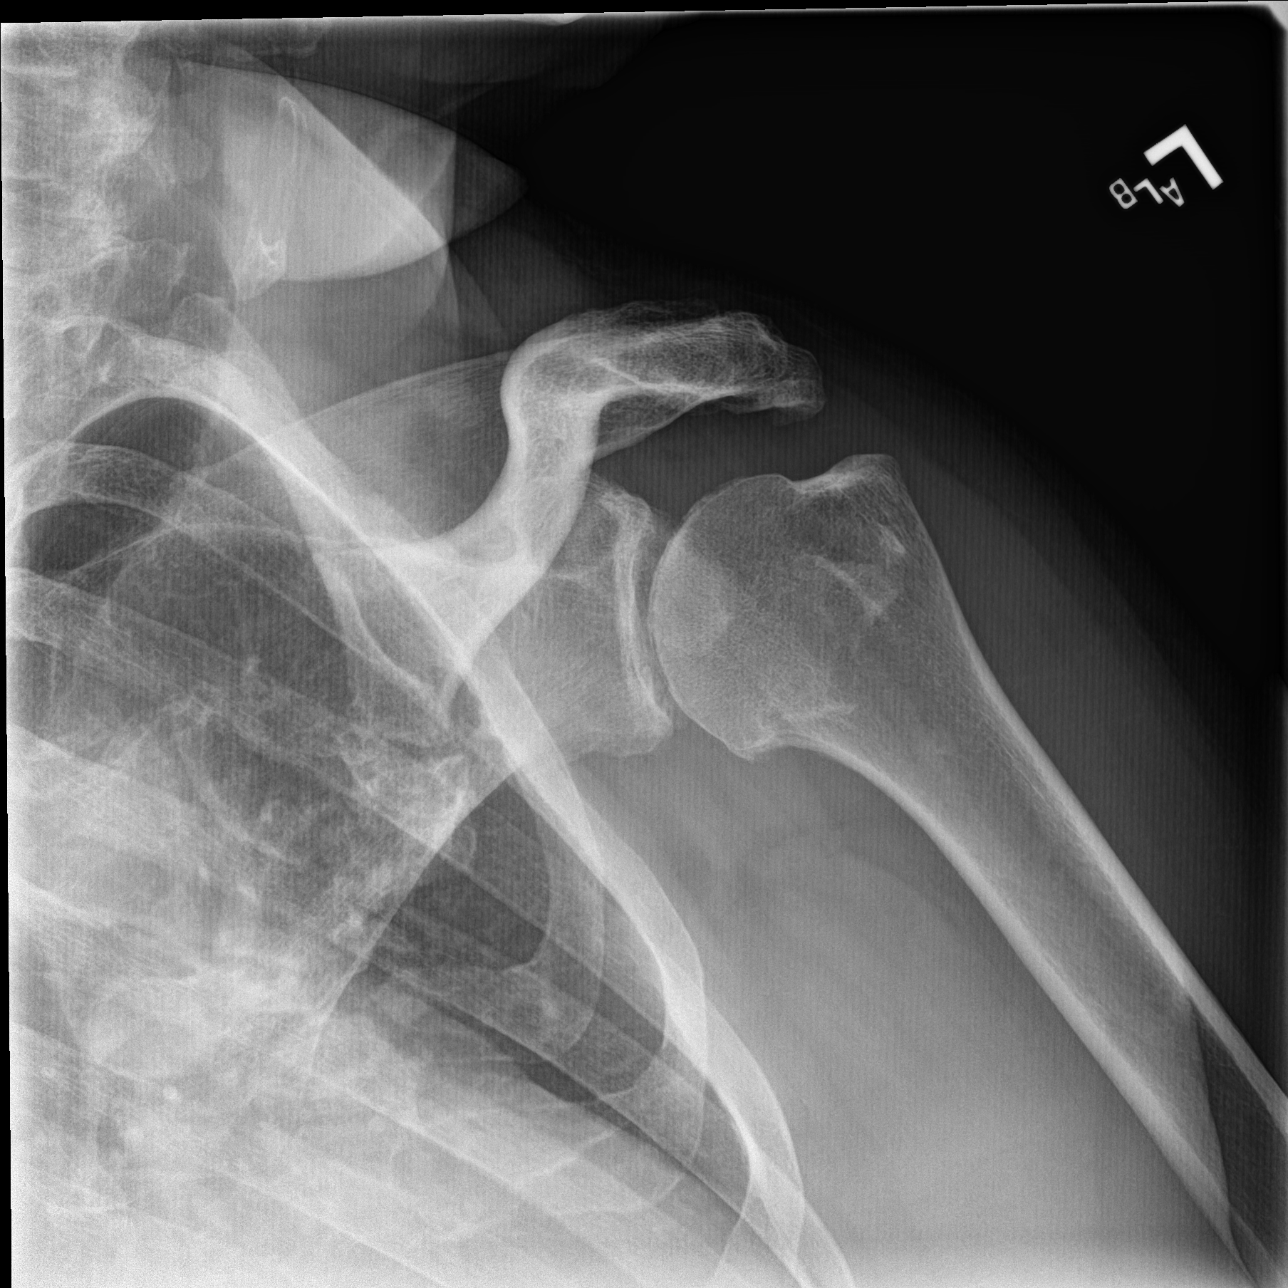

[shoulder y view]
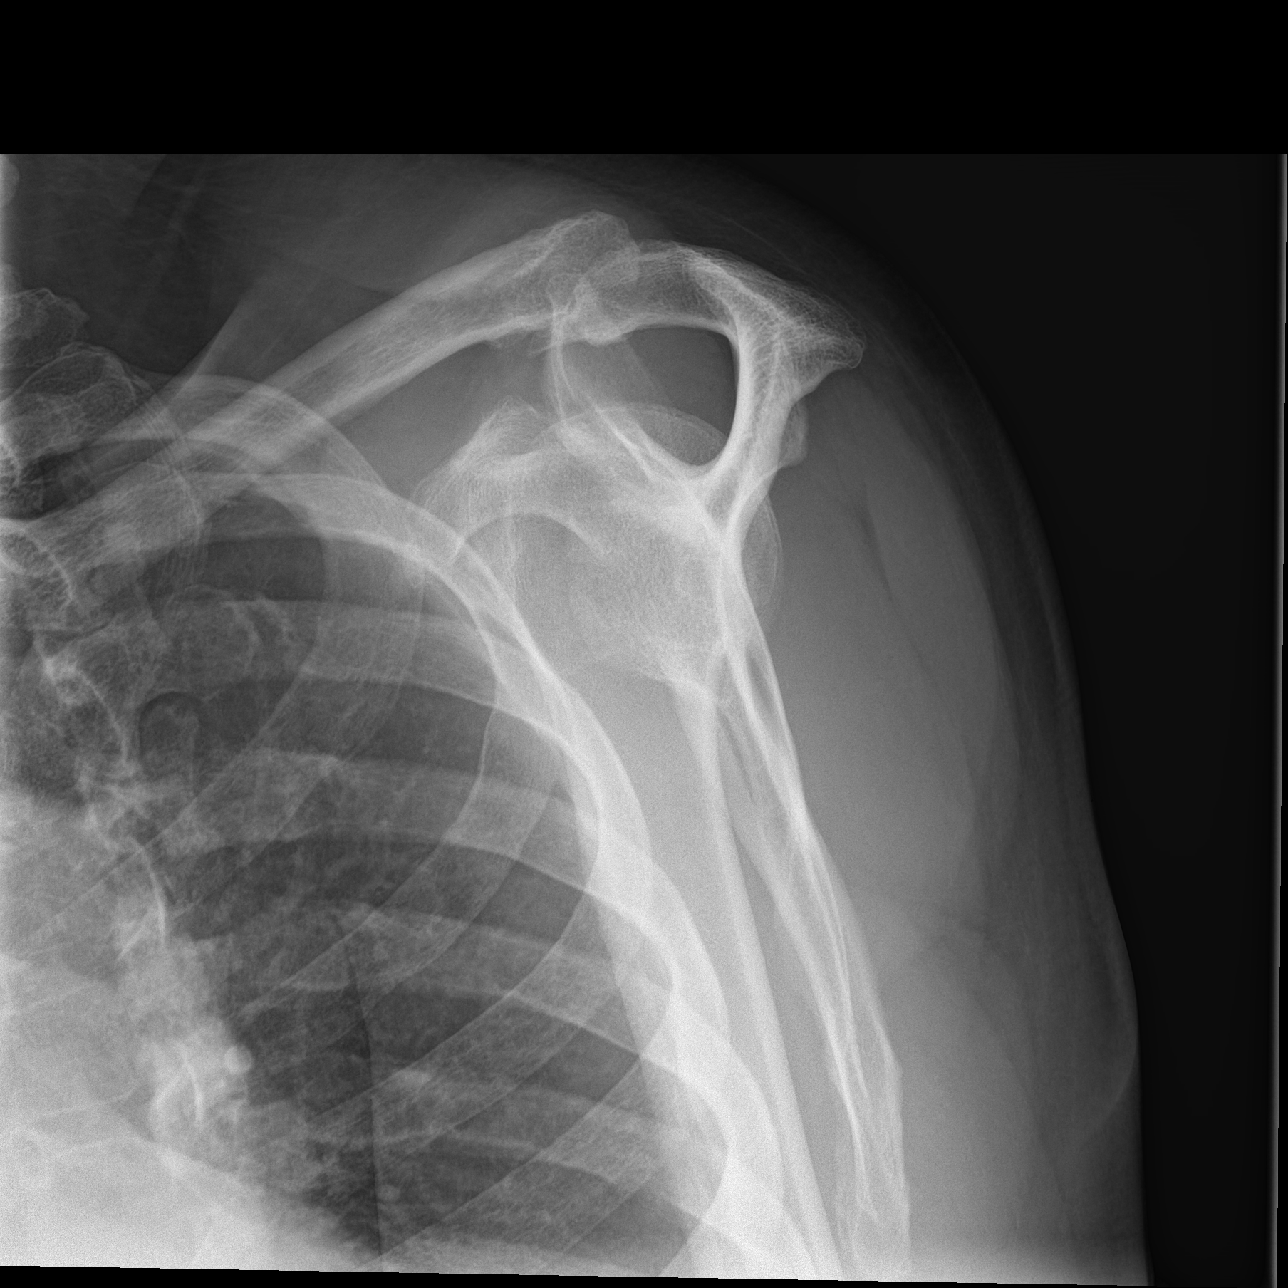

[shoulder axial]
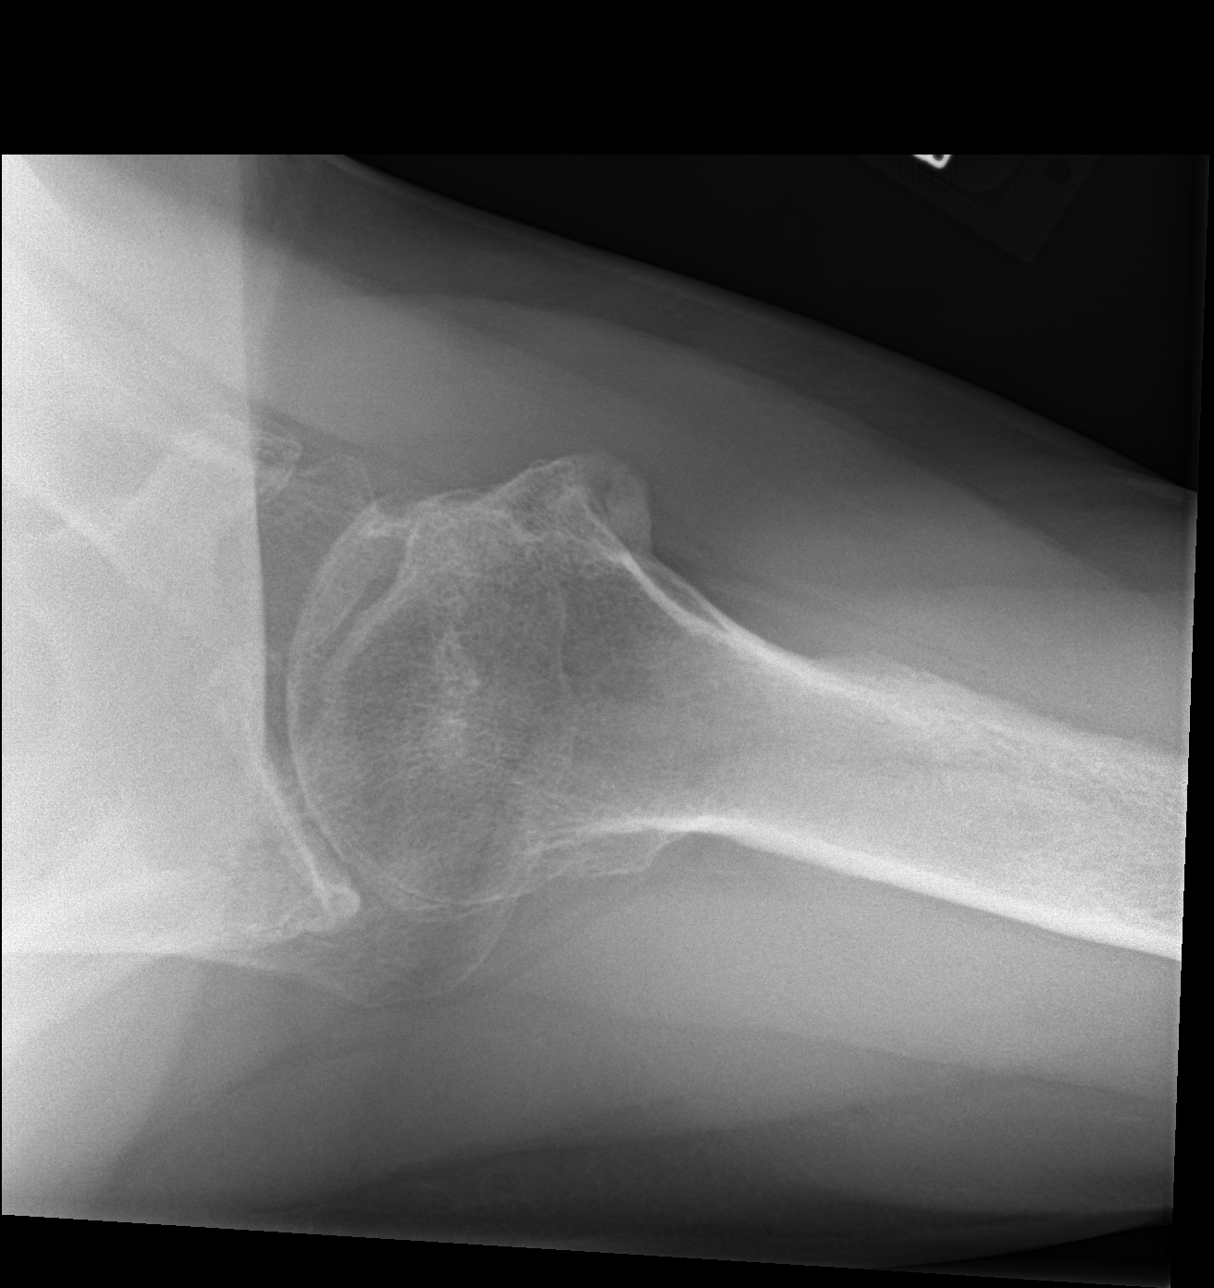

[3 of 3 positions shown; findings below may reference images not displayed]

FINDINGS: There is no evidence of fracture or dislocation. Moderate
glenohumeral and acromioclavicular degenerative change. Soft tissues
are unremarkable.
IMPRESSION: No evidence of acute fracture or dislocation.

Moderate glenohumeral and acromioclavicular degenerative change

## 2022-09-15 ENCOUNTER — Ambulatory Visit
Admission: EM | Admit: 2022-09-15 | Discharge: 2022-09-15 | Disposition: A | Discharge status: Home / Self Care | Payer: 59 | Attending: Family Medicine | Admitting: Family Medicine

## 2022-09-15 ENCOUNTER — Encounter: Payer: Self-pay | Admitting: Emergency Medicine

## 2022-09-15 DIAGNOSIS — Z79891 Long term (current) use of opiate analgesic: Secondary | ICD-10-CM | POA: Diagnosis not present

## 2022-09-15 DIAGNOSIS — Z794 Long term (current) use of insulin: Secondary | ICD-10-CM | POA: Diagnosis not present

## 2022-09-15 DIAGNOSIS — E11649 Type 2 diabetes mellitus with hypoglycemia without coma: Secondary | ICD-10-CM

## 2022-09-15 DIAGNOSIS — M545 Low back pain, unspecified: Secondary | ICD-10-CM | POA: Diagnosis not present

## 2022-09-15 LAB — GLUCOSE, CAPILLARY: Glucose-Capillary: 51 mg/dL — ABNORMAL LOW (ref 70–99)

## 2022-09-15 MED ORDER — LIDOCAINE 5 % EX PTCH: 1.0000 | MEDICATED_PATCH | CUTANEOUS | 0 refills | Status: AC

## 2022-09-15 MED ORDER — BACLOFEN 10 MG PO TABS: 10.0000 mg | ORAL_TABLET | Freq: Three times a day (TID) | ORAL | 0 refills | Status: AC | PRN

## 2022-09-15 MED ORDER — GLUCOSE 4 G PO CHEW
2.0000 | CHEWABLE_TABLET | Freq: Once | ORAL | Status: AC
  Administered 2022-09-15: 8 g via ORAL

## 2022-09-15 NOTE — ED Provider Notes (Signed)
[facility]    CSN: [id number] Arrival date & time: 09/15/22  1102      History   Chief Complaint Chief Complaint  Patient presents with   Back Pain    Right lower    HPI Jose Richardson is a 67 y.o. male presenting for right lower back pain since yesterday.  He states that when he woke up he felt the pain.  It does not radiate.  It is not associated with any leg weakness, numbness, loss of bowel or bladder control.  He denies any injuries.  He states that he has a history of chronic right hip pain and has had a hip replacement several years ago.  Reports that he takes methadone and Lyrica for the chronic pain.  He took those medications yesterday and reports that his initial 9 out of 10 pain went down to a 5 out of 10 and he was feeling better.  He says he has had pain like this before when he has "slept wrong."  He normally sleeps on his back but says sometimes he is up on the side.  Denies fever, chills, fatigue, urinary frequency, dysuria, hematuria.   He is insulin-dependent diabetic.  Reports that his blood sugar was in the 160s when he woke up this morning so he took 60 units of insulin and ate 2 pieces of toast.  Upon arrival to urgent care his blood sugar is 51.  Patient given 2 glucose tablets.  Blood sugar does increase to 115.  He is not reporting any dizziness or weakness.  HPI  Past Medical History:  Diagnosis Date   Chronic pain    CVA (cerebral vascular accident) ([place]) 2009   Diabetes mellitus type 2, insulin dependent ([place])    Diabetic retinopathy ([place])    GERD (gastroesophageal reflux disease)    Hypertension     Patient Active Problem List   Diagnosis Date Noted   Type 2 diabetes mellitus with diabetic neuropathy, with long-term current use of insulin ([place])    Chest pain 03/16/2020   Hx of completed stroke 03/16/2020   First degree AV block 03/16/2020   Hypertension, essential 02/13/2011   Diabetes mellitus type II, uncontrolled 02/13/2011     Past Surgical History:  Procedure Laterality Date   FRACTURE SURGERY         Home Medications    Prior to Admission medications   Medication Sig Start Date End Date Taking? Authorizing Provider  amLODipine (NORVASC) 10 MG tablet Take 10 mg by mouth daily. 02/16/20  Yes [provider]  baclofen (LIORESAL) 10 MG tablet Take 1 tablet (10 mg total) by mouth 3 (three) times daily as needed for muscle spasms. 09/15/22  Yes Danton Clap, PA-C  clopidogrel (PLAVIX) 75 MG tablet Take 75 mg by mouth daily. 03/07/20  Yes [provider]  insulin aspart (NOVOLOG) 100 UNIT/ML injection Inject 10 Units into the skin 3 (three) times daily with meals.    Yes [provider]  insulin glargine (LANTUS) 100 UNIT/ML injection Inject 0.3 mLs (30 Units total) into the skin at bedtime. 03/17/20  Yes Wieting, Richard, MD  lidocaine (LIDODERM) 5 % Place 1 patch onto the skin daily. Remove & Discard patch within 12 hours or as directed by MD 09/15/22  Yes Danton Clap, PA-C  losartan (COZAAR) 25 MG tablet Take 25 mg daily by mouth.   Yes [provider]  lovastatin (MEVACOR) 20 MG tablet Take 20 mg at bedtime by mouth.  Yes [provider]  methadone (DOLOPHINE) 10 MG tablet Take 10 mg every 8 (eight) hours by mouth.   Yes [provider]  Multiple Vitamin (MULTIVITAMIN WITH MINERALS) TABS tablet Take 1 tablet by mouth daily.   Yes [provider]  Pregabalin (LYRICA PO) Take 500 mg by mouth 2 (two) times daily.    Yes [provider]  glucagon 1 MG injection Inject 1 mg into the muscle as needed. For hypoglycemia. 06/13/18   [provider]  metFORMIN (GLUCOPHAGE) 500 MG tablet Take 500 mg 2 (two) times daily with a meal by mouth.    [provider]  OZEMPIC, 0.25 OR 0.5 MG/DOSE, 2 MG/1.5ML SOPN Inject 0.25 mg into the skin once a week. 03/06/20   [provider]  pantoprazole (PROTONIX) 40 MG tablet Take 1 tablet  (40 mg total) by mouth daily. 03/17/20 04/16/20  Loletha Grayer, MD    Family History Family History  Problem Relation Age of Onset   Cancer Mother    Stroke Father     Social History Social History   Tobacco Use   Smoking status: Never   Smokeless tobacco: Never  Vaping Use   Vaping Use: Never used  Substance Use Topics   Alcohol use: No   Drug use: No     Allergies   Patient has no known allergies.   Review of Systems Review of Systems  Constitutional:  Negative for fatigue and fever.  Gastrointestinal:  Negative for abdominal pain, nausea and vomiting.  Genitourinary:  Negative for difficulty urinating, dysuria, flank pain, frequency and hematuria.  Musculoskeletal:  Positive for arthralgias (chronic right hip pain), back pain and gait problem (due to previous stroke causing right sided weakness. Wears a right lower leg brace and has a crutch).  Neurological:  Negative for dizziness, weakness and numbness.     Physical Exam Triage Vital Signs ED Triage Vitals  Enc Vitals Group     BP 09/15/22 1130 133/76     Pulse Rate 09/15/22 1130 67     Resp 09/15/22 1130 16     Temp 09/15/22 1130 98.3 F (36.8 C)     Temp Source 09/15/22 1130 Oral     SpO2 09/15/22 1130 97 %     Weight 09/15/22 1131 231 lb 14.8 oz (105.2 kg)     Height 09/15/22 1131 5\' 6"  (1.676 m)     Head Circumference --      Peak Flow --      Pain Score 09/15/22 1130 5     Pain Loc --      Pain Edu? --      Excl. in [place]? --    No data found.  Updated Vital Signs BP 133/76 (BP Location: Right Arm)   Pulse 67   Temp 98.3 F (36.8 C) (Oral)   Resp 16   Ht 5\' 6"  (1.676 m)   Wt 231 lb 14.8 oz (105.2 kg)   SpO2 97%   BMI 37.43 kg/m    Physical Exam Vitals and nursing note reviewed.  Constitutional:      General: He is not in acute distress.    Appearance: Normal appearance. He is well-developed.  HENT:     Head: Normocephalic and atraumatic.  Eyes:     General: No scleral icterus.     Conjunctiva/sclera: Conjunctivae normal.  Cardiovascular:     Rate and Rhythm: Normal rate and regular rhythm.     Heart sounds: Normal heart sounds.  Pulmonary:     Effort: Pulmonary effort is normal. No respiratory distress.     Breath sounds: Normal breath sounds.  Abdominal:     Palpations: Abdomen is soft.     Tenderness: There is no abdominal tenderness. There is no right CVA tenderness or left CVA tenderness.  Musculoskeletal:     Cervical back: Neck supple.     Lumbar back: Tenderness (TTP right paralumbar muscles) present. No bony tenderness. Decreased range of motion. Negative right straight leg raise test and negative left straight leg raise test.       Back:  Skin:    General: Skin is warm and dry.     Capillary Refill: Capillary refill takes less than 2 seconds.  Neurological:     General: No focal deficit present.     Mental Status: He is alert. Mental status is at baseline.     Motor: No weakness.     Gait: Gait abnormal (due to sequela from previous stroke).  Psychiatric:        Mood and Affect: Mood normal.        Behavior: Behavior normal.      UC Treatments / Results  Labs (all labs ordered are listed, but only abnormal results are displayed) Labs Reviewed  GLUCOSE, CAPILLARY - Abnormal; Notable for the following components:      Result Value   Glucose-Capillary 51 (*)    All other components within normal limits  CBG MONITORING, ED    EKG   Radiology No results found.  Procedures Procedures (including critical care time)  Medications Ordered in UC Medications  glucose chewable tablet 8 g (8 g Oral Given 09/15/22 1126)    Initial Impression / Assessment and Plan / UC Course  I have reviewed the triage vital signs and the nursing notes.  Pertinent labs & imaging results that were available during my care of the patient were reviewed by me and considered in my medical decision making (see chart for details).   67 year old male with history of  stroke and right-sided weakness, chronic right hip pain following hip replacement-on long-term methadone and Lyrica, insulin-dependent type 2 diabetes presents for right lower back pain since yesterday.  Denies injury.  Reports a couple months ago he had the similar pain that resolved within a few days to week.  This time he has not had any fever, chills, urinary symptoms.  Denies any pain radiating down the leg, numbness, increased weakness or tingling.  His vitals normal and stable and he is overall well-appearing.  Sitting in wheelchair.  He has a crutch, brace on his right lower leg and abnormal gait related to sequela from previous stroke.  On exam he has tenderness palpation of the right paralumbar muscles but no spinal tenderness.  Slightly reduced range of motion of back.  Negative straight leg raise bilaterally.  Chest clear to auscultation heart regular rate and rhythm.  As mentioned, patient did have blood sugar of 51-52 when he came into the clinic.  He was given to 4 mg chewable tablets of glucose.  This is elevated to 115.  Checked again with his at home monitor.  Denies any symptoms.  Suspect muscle strain/spasms.  Will have him continue with his home methadone and Lyrica.  Added baclofen and lidocaine patches.  Advised supportive care and close monitoring.  Reviewed return to ER precautions related to back pain and hypoglycemia.  Advised him to keep a check on his blood sugar and make sure to eat  sufficient meal when taking insulin.   Final Clinical Impressions(s) / UC Diagnoses   Final diagnoses:  Acute right-sided low back pain without sciatica  Type 2 diabetes mellitus with hypoglycemia without coma, with long-term current use of insulin (HCC)  Long term current use of methadone for pain control     Discharge Instructions      -Continue methadone and Lyrica.  You can also take the muscle relaxer called baclofen 3 times a day as needed.  I sent a lidocaine patch to the  pharmacy.  You can apply this as well.  Use ice and heat but do not apply heat over the patch if you are wearing it.  Stretch. - You should be feeling better in a week but if your symptoms worsen or you start to feel increased weakness/numbness in your legs or have a fall you should go immediately to the ER.  For any loss of bowel or bladder control, go to ER. -Blood sugar came up to 115, a normal level. Continue to monitor and eat when you take insulin.     ED Prescriptions     Medication Sig Dispense Auth. Provider   baclofen (LIORESAL) 10 MG tablet Take 1 tablet (10 mg total) by mouth 3 (three) times daily as needed for muscle spasms. 30 each Eusebio Friendly B, PA-C   lidocaine (LIDODERM) 5 % Place 1 patch onto the skin daily. Remove & Discard patch within 12 hours or as directed by MD 10 patch Shirlee Latch, PA-C      PDMP not reviewed this encounter.   Shirlee Latch, PA-C 09/15/22 1217

## 2022-09-15 NOTE — ED Notes (Signed)
While waiting in waiting room. Pt informed receptionist that his glucose device was beeping that his blood sugar was low at 53. Pulled pt to triage and blood sugar was 51. Pt given 8 mg glucose tablet per Dr. Susa Simmonds.

## 2022-09-15 NOTE — Discharge Instructions (Addendum)
-  Continue methadone and Lyrica.  You can also take the muscle relaxer called baclofen 3 times a day as needed.  I sent a lidocaine patch to the pharmacy.  You can apply this as well.  Use ice and heat but do not apply heat over the patch if you are wearing it.  Stretch. - You should be feeling better in a week but if your symptoms worsen or you start to feel increased weakness/numbness in your legs or have a fall you should go immediately to the ER.  For any loss of bowel or bladder control, go to ER. -Blood sugar came up to 115, a normal level. Continue to monitor and eat when you take insulin.

## 2022-09-15 NOTE — ED Triage Notes (Addendum)
Pt c/o right lower back pain. Started yesterday. He states he took his methadone and pain has gotten better but he called his methadone prescriber and was told to come to UC. Denies urinary symptoms. No known injury or falls.

## 2023-09-29 ENCOUNTER — Encounter: Payer: Self-pay | Admitting: Emergency Medicine

## 2023-09-29 ENCOUNTER — Ambulatory Visit (INDEPENDENT_AMBULATORY_CARE_PROVIDER_SITE_OTHER): Payer: 59

## 2023-09-29 ENCOUNTER — Ambulatory Visit
Admission: EM | Admit: 2023-09-29 | Discharge: 2023-09-29 | Disposition: A | Discharge status: Home / Self Care | Payer: 59

## 2023-09-29 DIAGNOSIS — I10 Essential (primary) hypertension: Secondary | ICD-10-CM | POA: Diagnosis not present

## 2023-09-29 DIAGNOSIS — W009XXA Unspecified fall due to ice and snow, initial encounter: Secondary | ICD-10-CM

## 2023-09-29 DIAGNOSIS — R0781 Pleurodynia: Secondary | ICD-10-CM | POA: Diagnosis not present

## 2023-09-29 DIAGNOSIS — T07XXXA Unspecified multiple injuries, initial encounter: Secondary | ICD-10-CM | POA: Diagnosis not present

## 2023-09-29 NOTE — ED Triage Notes (Signed)
Patient states he fell on the ice about an hour ago and is c/o right rib discomfort.

## 2023-09-29 NOTE — Discharge Instructions (Signed)
-  I did not see any obvious rib fractures but I will call you if the radiologist does.  If you do have a fracture, take your home pain medication as needed and ice the area.  Make sure to take deep breaths so as to lower your risk of developing pneumonia.  If you experience fever, cough, congestion or shortness of breath return.  If you have worsening chest discomfort/rib pain or shortness of breath go to the ER. -Blood pressure was a little high today.  Make sure you continue taking your blood pressure medication and discuss this with your PCP at your follow-up visit in a few days.

## 2023-09-29 NOTE — ED Provider Notes (Signed)
[facility]    CSN: [id number] Arrival date & time: 09/29/23  1019      History   Chief Complaint Chief Complaint  Patient presents with   Rib Injury    HPI Jose Richardson is a 68 y.o. male presenting for right sided rib pain since this morning.  He reports slipping on ice and falling onto his right side.  He reports increased pain in his ribs when he moves, shrugs his shoulders and takes a deep breath.  Denies feeling short of breath.  He also has an abrasion of his elbow and fifth knuckle of the right hand.  Denies any elbow pain or swelling, forearm pain or swelling, hand pain or swelling.  No problems moving elbow, hand or wrist.  Patient is most concerned about his ribs.  Denies any pain in his chest.  Denies head injury or loss of consciousness.  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  He takes Lyrica and methadone as needed for pain.  He did take methadone this morning for his discomfort.  Of note, patient reports having tetanus immunization this past fall.  HPI  Past Medical History:  Diagnosis Date   Chronic pain    CVA (cerebral vascular accident) (HCC) 2009   Diabetes mellitus type 2, insulin dependent (HCC)    Diabetic retinopathy (HCC)    GERD (gastroesophageal reflux disease)    Hypertension     Patient Active Problem List   Diagnosis Date Noted   Type 2 diabetes mellitus with diabetic neuropathy, with long-term current use of insulin (HCC)    Chest pain 03/16/2020   Hx of completed stroke 03/16/2020   First degree AV block 03/16/2020   Hypertension, essential 02/13/2011   Diabetes mellitus type II, uncontrolled 02/13/2011    Past Surgical History:  Procedure Laterality Date   FRACTURE SURGERY         Home Medications    Prior to Admission medications   Medication Sig Start Date End Date Taking? Authorizing Provider  insulin  lispro (HUMALOG) 100 UNIT/ML injection Inject into the skin. 09/19/23  Yes [provider]  amLODipine (NORVASC) 10 MG tablet Take 10 mg by mouth daily. 02/16/20   [provider]  baclofen (LIORESAL) 10 MG tablet Take 1 tablet (10 mg total) by mouth 3 (three) times daily as needed for muscle spasms. 09/15/22   Shirlee Latch, PA-C  clopidogrel (PLAVIX) 75 MG tablet Take 75 mg by mouth daily. 03/07/20   [provider]  glucagon 1 MG injection Inject 1 mg into the muscle as needed. For hypoglycemia. 06/13/18   [provider]  insulin aspart (NOVOLOG) 100 UNIT/ML injection Inject 10 Units into the skin 3 (three) times daily with meals.     [provider]  insulin glargine (LANTUS) 100 UNIT/ML injection Inject 0.3 mLs (30 Units total) into the skin at bedtime. 03/17/20   Wieting, Richard, MD  lidocaine (LIDODERM) 5 % Place 1 patch onto the skin daily. Remove & Discard patch within 12 hours or as directed by MD 09/15/22   Shirlee Latch, PA-C  losartan (COZAAR) 25 MG tablet Take 25 mg daily by mouth.    [provider]  lovastatin (MEVACOR) 20 MG tablet Take 20 mg at bedtime by mouth.    [provider]  metFORMIN (GLUCOPHAGE) 500 MG tablet Take 500 mg  2 (two) times daily with a meal by mouth.    [provider]  methadone (DOLOPHINE) 10 MG tablet Take 10 mg every 8 (eight) hours by mouth.    [provider]  Multiple Vitamin (MULTIVITAMIN WITH MINERALS) TABS tablet Take 1 tablet by mouth daily.    [provider]  OZEMPIC, 0.25 OR 0.5 MG/DOSE, 2 MG/1.5ML SOPN Inject 0.25 mg into the skin once a week. 03/06/20   [provider]  pantoprazole (PROTONIX) 40 MG tablet Take 1 tablet (40 mg total) by mouth daily. 03/17/20 04/16/20  [name], MD  Pregabalin (LYRICA PO) Take 500 mg by mouth 2 (two) times daily.     [provider]    Family History Family History  Problem Relation Age of Onset   Cancer  Mother    Stroke Father     Social History Social History   Tobacco Use   Smoking status: Never   Smokeless tobacco: Never  Vaping Use   Vaping status: Never Used  Substance Use Topics   Alcohol use: No   Drug use: No     Allergies   Patient has no known allergies.   Review of Systems Review of Systems  Constitutional:  Negative for fatigue.  Eyes:  Negative for visual disturbance.  Respiratory:  Negative for shortness of breath.   Cardiovascular:  Negative for chest pain.  Gastrointestinal:  Negative for abdominal pain, nausea and vomiting.  Musculoskeletal:  Positive for arthralgias (right ribs). Negative for back pain, gait problem (due to previous stroke causing right sided weakness. Wears a right lower leg brace and has a crutch), joint swelling and neck pain.  Skin:  Positive for wound. Negative for color change.  Neurological:  Negative for dizziness, syncope, weakness, numbness and headaches.     Physical Exam Triage Vital Signs  No data found.  Updated Vital Signs BP (!) 148/77 (BP Location: Right Arm)   Pulse 67   Temp 98 F (36.7 C) (Oral)   Resp 18   SpO2 100%    Physical Exam Vitals and nursing note reviewed.  Constitutional:      Appearance: Normal appearance. He is not ill-appearing.  HENT:     Head: Normocephalic and atraumatic.     Nose: Nose normal.     Mouth/Throat:     Mouth: Mucous membranes are moist.     Pharynx: Oropharynx is clear.  Eyes:     General: No scleral icterus.    Extraocular Movements: Extraocular movements intact.     Conjunctiva/sclera: Conjunctivae normal.     Pupils: Pupils are equal, round, and reactive to light.  Cardiovascular:     Rate and Rhythm: Normal rate and regular rhythm.  Pulmonary:     Effort: Pulmonary effort is normal.     Breath sounds: Normal breath sounds. No wheezing, rhonchi or rales.  Chest:     Chest wall: Tenderness (right anterior/lateral ribs) present.  Musculoskeletal:      Cervical back: Normal range of motion and neck supple. No tenderness.  Skin:    General: Skin is warm and dry.     Findings: Wound present. No bruising.     Comments: Small abrasion right posterior elbow and right dorsal 5th MCP. No swelling or contusions of RUE and full ROM of elbow, shoulder, wrist, hand grip.  Neurological:     General: No focal deficit present.     Mental Status: He is alert and oriented to person, place, and time. Mental status  is at baseline.     Cranial Nerves: No cranial nerve deficit (grossly intact).     Motor: No weakness.     Coordination: Coordination normal.     Gait: Gait abnormal (due to sequela from previous stroke. Walks with cane).  Psychiatric:        Mood and Affect: Mood normal.        Behavior: Behavior normal.       UC Treatments / Results  Labs (all labs ordered are listed, but only abnormal results are displayed) Labs Reviewed - No data to display   EKG   Radiology DG Ribs Unilateral W/Chest Right Result Date: 09/29/2023 CLINICAL DATA:  Right-sided rib pain after falling on ice. EXAM: RIGHT RIBS AND CHEST - 3+ VIEW COMPARISON:  Chest x-ray dated March 16, 2020. FINDINGS: No fracture or other bone lesions are seen involving the ribs. There is no evidence of pneumothorax or pleural effusion. Both lungs are clear. Heart size and mediastinal contours are within normal limits. IMPRESSION: Negative. Electronically Signed   By: Obie Dredge M.D.   On: 09/29/2023 11:37    Procedures Procedures (including critical care time)  Medications Ordered in UC Medications - No data to display   Initial Impression / Assessment and Plan / UC Course  I have reviewed the triage vital signs and the nursing notes.  Pertinent labs & imaging results that were available during my care of the patient were reviewed by me and considered in my medical decision making (see chart for details).   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  His vitals are stable and he is overall well-appearing.  Sitting in chair. He has a crutch, brace on his right lower leg and abnormal gait related to sequela from previous stroke.  On exam he has tenderness to palpation of the right anterior/lateral ribs.  Chest clear to auscultation heart regular rate and rhythm.  There is a small abrasion on the right posterior elbow and the right dorsal hand.  Right rib/chest x-ray obtained.  Wet read negative.  Discussed with patient.  Advised him I will contact him if the radiologist sees any abnormalities which would change the treatment plan.  Soft tissue injury/rib contusion.  Will have him continue with his home methadone and Lyrica.  Advised supportive care and close monitoring.  Reviewed return to ER precautions related to rib pain and falls.   Discussed wound care of the multiple abrasions.  Follow-up with any sign of infection.  Blood pressure reading elevated at 168/84.  Repeat is 148/77.  Patient states he takes his blood pressure medicine later in the day.  It is normally under better control than this.  At this time continue losartan and Norvasc.  He has an appointment with his PCP in 5 days.  Advised him to keep a log and follow-up with his PCP regarding elevated blood pressures.   Final Clinical Impressions(s) / UC Diagnoses   Final diagnoses:  Rib pain on right side  Fall due to slipping on ice or snow, initial encounter  Essential hypertension  Multiple abrasions     Discharge Instructions      -I did not see any obvious rib fractures but I will call you if the radiologist does.  If you do have a fracture, take your  home pain medication as needed and ice the area.  Make sure to take deep breaths so as to lower your risk  of developing pneumonia.  If you experience fever, cough, congestion or shortness of breath return.  If you have worsening chest discomfort/rib pain or shortness of breath go to the ER. -Blood pressure was a little high today.  Make sure you continue taking your blood pressure medication and discuss this with your PCP at your follow-up visit in a few days.      ED Prescriptions   None    PDMP not reviewed this encounter.      Shirlee Latch, PA-C 09/29/23 1153

## 2023-12-21 ENCOUNTER — Telehealth: Payer: Self-pay | Admitting: Family Medicine

## 2023-12-21 NOTE — Telephone Encounter (Signed)
 ERROR
